# Patient Record
Sex: Female | Born: 2000 | Race: White | Hispanic: No | Marital: Single | Smoking: Former smoker
Health system: Southern US, Community
[De-identification: ages and names within clinical notes are randomized; demographics above are authoritative.]

## PROBLEM LIST (undated history)

## (undated) DIAGNOSIS — F419 Anxiety disorder, unspecified: Secondary | ICD-10-CM

## (undated) DIAGNOSIS — D649 Anemia, unspecified: Secondary | ICD-10-CM

## (undated) DIAGNOSIS — M329 Systemic lupus erythematosus, unspecified: Secondary | ICD-10-CM

## (undated) DIAGNOSIS — E538 Deficiency of other specified B group vitamins: Secondary | ICD-10-CM

## (undated) HISTORY — DX: Anemia, unspecified: D64.9

## (undated) HISTORY — DX: Deficiency of other specified B group vitamins: E53.8

## (undated) HISTORY — PX: NO PAST SURGERIES: SHX2092

## (undated) HISTORY — DX: Anxiety disorder, unspecified: F41.9

---
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
## 2020-06-10 ENCOUNTER — Emergency Department (HOSPITAL_BASED_OUTPATIENT_CLINIC_OR_DEPARTMENT_OTHER): Payer: No Typology Code available for payment source

## 2020-06-10 ENCOUNTER — Encounter (HOSPITAL_BASED_OUTPATIENT_CLINIC_OR_DEPARTMENT_OTHER): Payer: Self-pay | Admitting: Emergency Medicine

## 2020-06-10 ENCOUNTER — Other Ambulatory Visit: Payer: Self-pay

## 2020-06-10 DIAGNOSIS — Y929 Unspecified place or not applicable: Secondary | ICD-10-CM | POA: Insufficient documentation

## 2020-06-10 DIAGNOSIS — S20219A Contusion of unspecified front wall of thorax, initial encounter: Secondary | ICD-10-CM | POA: Insufficient documentation

## 2020-06-10 DIAGNOSIS — Y939 Activity, unspecified: Secondary | ICD-10-CM | POA: Diagnosis not present

## 2020-06-10 DIAGNOSIS — S92424A Nondisplaced fracture of distal phalanx of right great toe, initial encounter for closed fracture: Secondary | ICD-10-CM | POA: Diagnosis not present

## 2020-06-10 DIAGNOSIS — S90931A Unspecified superficial injury of right great toe, initial encounter: Secondary | ICD-10-CM | POA: Diagnosis present

## 2020-06-10 DIAGNOSIS — Y999 Unspecified external cause status: Secondary | ICD-10-CM | POA: Diagnosis not present

## 2020-06-10 DIAGNOSIS — S0081XA Abrasion of other part of head, initial encounter: Secondary | ICD-10-CM | POA: Insufficient documentation

## 2020-06-10 DIAGNOSIS — R040 Epistaxis: Secondary | ICD-10-CM | POA: Diagnosis not present

## 2020-06-10 DIAGNOSIS — S0083XA Contusion of other part of head, initial encounter: Secondary | ICD-10-CM | POA: Insufficient documentation
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
## 2020-06-11 ENCOUNTER — Emergency Department (HOSPITAL_BASED_OUTPATIENT_CLINIC_OR_DEPARTMENT_OTHER)
Admission: EM | Admit: 2020-06-11 | Discharge: 2020-06-11 | Disposition: A | Discharge status: Home / Self Care | Payer: No Typology Code available for payment source | Attending: Emergency Medicine | Admitting: Emergency Medicine

## 2020-06-11 DIAGNOSIS — S0081XA Abrasion of other part of head, initial encounter: Secondary | ICD-10-CM

## 2020-06-11 DIAGNOSIS — S0083XA Contusion of other part of head, initial encounter: Secondary | ICD-10-CM

## 2020-06-11 DIAGNOSIS — R040 Epistaxis: Secondary | ICD-10-CM

## 2020-06-11 DIAGNOSIS — S92424A Nondisplaced fracture of distal phalanx of right great toe, initial encounter for closed fracture: Secondary | ICD-10-CM

## 2020-06-11 DIAGNOSIS — S20219A Contusion of unspecified front wall of thorax, initial encounter: Secondary | ICD-10-CM

## 2020-06-11 MED ORDER — NAPROXEN 250 MG PO TABS
500.0000 mg | ORAL_TABLET | Freq: Once | ORAL | Status: AC
  Administered 2020-06-11: 500 mg via ORAL
  Filled 2020-06-11: qty 2

## 2020-06-11 MED ORDER — NAPROXEN 375 MG PO TABS: ORAL_TABLET | ORAL | 0 refills | Status: DC
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
## 2022-05-06 DIAGNOSIS — J029 Acute pharyngitis, unspecified: Secondary | ICD-10-CM | POA: Diagnosis not present

## 2022-05-21 ENCOUNTER — Encounter (HOSPITAL_COMMUNITY): Payer: Self-pay

## 2022-05-21 ENCOUNTER — Emergency Department (HOSPITAL_COMMUNITY)
Admission: EM | Admit: 2022-05-21 | Discharge: 2022-05-21 | Discharge status: Against Medical Advice | Payer: 59 | Attending: Emergency Medicine | Admitting: Emergency Medicine

## 2022-05-21 ENCOUNTER — Other Ambulatory Visit: Payer: Self-pay

## 2022-05-21 ENCOUNTER — Emergency Department (HOSPITAL_COMMUNITY): Payer: 59

## 2022-05-21 DIAGNOSIS — N939 Abnormal uterine and vaginal bleeding, unspecified: Secondary | ICD-10-CM | POA: Diagnosis not present

## 2022-05-21 DIAGNOSIS — N930 Postcoital and contact bleeding: Secondary | ICD-10-CM | POA: Diagnosis not present

## 2022-05-21 DIAGNOSIS — N854 Malposition of uterus: Secondary | ICD-10-CM | POA: Diagnosis not present

## 2022-05-21 DIAGNOSIS — R109 Unspecified abdominal pain: Secondary | ICD-10-CM | POA: Diagnosis not present

## 2022-05-21 DIAGNOSIS — Z5321 Procedure and treatment not carried out due to patient leaving prior to being seen by health care provider: Secondary | ICD-10-CM | POA: Insufficient documentation

## 2022-05-21 LAB — URINALYSIS, ROUTINE W REFLEX MICROSCOPIC
Bacteria, UA: NONE SEEN
Bilirubin Urine: NEGATIVE
Glucose, UA: NEGATIVE mg/dL
Ketones, ur: 5 mg/dL — AB
Nitrite: NEGATIVE
Protein, ur: NEGATIVE mg/dL
Specific Gravity, Urine: 1.027 (ref 1.005–1.030)
pH: 5 (ref 5.0–8.0)

## 2022-05-21 LAB — COMPREHENSIVE METABOLIC PANEL
ALT: 17 U/L (ref 0–44)
AST: 16 U/L (ref 15–41)
Albumin: 3.5 g/dL (ref 3.5–5.0)
Alkaline Phosphatase: 44 U/L (ref 38–126)
Anion gap: 8 (ref 5–15)
BUN: 10 mg/dL (ref 6–20)
CO2: 23 mmol/L (ref 22–32)
Calcium: 9.2 mg/dL (ref 8.9–10.3)
Chloride: 106 mmol/L (ref 98–111)
Creatinine, Ser: 0.61 mg/dL (ref 0.44–1.00)
GFR, Estimated: >60 mL/min (ref >60)
Glucose, Bld: 87 mg/dL (ref 70–99)
Potassium: 3.6 mmol/L (ref 3.5–5.1)
Sodium: 137 mmol/L (ref 135–145)
Total Bilirubin: 0.5 mg/dL (ref 0.3–1.2)
Total Protein: 7.1 g/dL (ref 6.5–8.1)

## 2022-05-21 LAB — I-STAT BETA HCG BLOOD, ED (MC, WL, AP ONLY): I-stat hCG, quantitative: <5 m[IU]/mL (ref <5)

## 2022-05-21 LAB — LIPASE, BLOOD: Lipase: 30 U/L (ref 11–51)

## 2022-05-21 LAB — CBC
HCT: 32.4 % — ABNORMAL LOW (ref 36.0–46.0)
Hemoglobin: 9.8 g/dL — ABNORMAL LOW (ref 12.0–15.0)
MCH: 19.4 pg — ABNORMAL LOW (ref 26.0–34.0)
MCHC: 30.2 g/dL (ref 30.0–36.0)
MCV: 64 fL — ABNORMAL LOW (ref 80.0–100.0)
Platelets: 318 10*3/uL (ref 150–400)
RBC: 5.06 MIL/uL (ref 3.87–5.11)
RDW: 14.7 % (ref 11.5–15.5)
WBC: 9.4 10*3/uL (ref 4.0–10.5)
nRBC: 0 % (ref 0.0–0.2)
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
## 2022-05-21 NOTE — ED Provider Triage Note (Signed)
Emergency Medicine Provider Triage Evaluation Note  Anita [name] , a 21 y.o. female  was evaluated in triage.  Pt complains of vaginal bleeding, severe abdominal pain that radiates to the back for the last 2 weeks.  Patient reports that she is on birth control, not supposed to have her period for at least 2 weeks.  Patient reports that she was having fairly rough sex with her partner, but she has easy bleeding with any intercourse or fingering.  Patient reports that this was slightly rougher than normal, but she was not having significant pain during intercourse.  Patient reports that [facility] to [facility], but was told to go straight to the emergency department for further evaluation because they cannot do anything there.  Patient reports of large volume of bleeding with blood running down bilateral legs and onto sheets.  Review of Systems  Positive: Vaginal bleeding, abdominal/pelvic pain Negative: Nausea, vomiting, diarrhea  Physical Exam  BP 121/86   Pulse 84   Temp 98 F (36.7 C)   Resp 18   Ht 5\' 2"  (1.575 m)   Wt 55.3 kg   SpO2 99%   BMI 22.31 kg/m  Gen:   Awake, no distress   Resp:  Normal effort  MSK:   Moves extremities without difficulty  Other:  minimal tenderness palpation suprapubically, no rebound, rigidity, guarding  Medical Decision Making  Medically screening exam initiated at 6:13 PM.  Appropriate orders placed.  RAEGAN WINDERS was informed that the remainder of the evaluation will be completed by another provider, this initial triage assessment does not replace that evaluation, and the importance of remaining in the ED until their evaluation is complete.  Workup initiated   Maretta Los, Olene Floss 05/21/22 1815

## 2022-05-21 NOTE — ED Notes (Signed)
Pt left AMA.  Pt said if anything get worse she will come back.

## 2022-10-09 ENCOUNTER — Encounter: Payer: Self-pay | Admitting: Plastic Surgery

## 2022-10-09 ENCOUNTER — Ambulatory Visit (INDEPENDENT_AMBULATORY_CARE_PROVIDER_SITE_OTHER): Payer: Self-pay | Admitting: Plastic Surgery

## 2022-10-09 VITALS — BP 122/78 | HR 100 | Ht 63.0 in | Wt 125.4 lb

## 2022-10-09 DIAGNOSIS — Z719 Counseling, unspecified: Secondary | ICD-10-CM

## 2022-10-09 DIAGNOSIS — M950 Acquired deformity of nose: Secondary | ICD-10-CM

## 2022-10-09 NOTE — Progress Notes (Signed)
     Patient ID: Anita [name], female    DOB: [birth year], 22 y.o.   MRN: [id number]   Chief Complaint  Patient presents with   Advice Only    The patient is a 22 year old female here with mom for evaluation of her nose.  The patient states that for as long as she can remember she has not liked the size or look of her nose.  She is very happy with the tip.  It deviates slightly to the right.  She is not aware of any trauma.  The main issue is she does not like her dorsal hump.  She has no breathing issues and has not had nasal surgery in the past.  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  No airway issues.    Review of Systems  Constitutional: Negative.   HENT: Negative.    Eyes: Negative.   Respiratory: Negative.    Cardiovascular: Negative.   Gastrointestinal: Negative.   Endocrine: Negative.   Genitourinary: Negative.   Musculoskeletal: Negative.     History reviewed. No pertinent past medical history.  History reviewed. No pertinent surgical history.    Current Outpatient Medications:    Norgestimate-Ethinyl Estradiol Triphasic (TRI-LO-SPRINTEC) 0.18/0.215/0.25 MG-25 MCG tab, Take by mouth., Disp: , Rfl:    Objective:   Vitals:   10/09/22 1141  BP: 122/78  Pulse: 100  SpO2: 100%    Physical Exam Vitals reviewed.  Constitutional:      Appearance: Normal appearance.  HENT:     Head: Normocephalic and atraumatic.  Cardiovascular:     Rate and Rhythm: Normal rate.     Pulses: Normal pulses.  Musculoskeletal:        General: No swelling or deformity.  Skin:    General: Skin is warm.     Capillary Refill: Capillary refill takes less than 2 seconds.     Coloration: Skin is not jaundiced or pale.     Findings: No bruising or lesion.  Neurological:     Mental Status: She is alert and oriented to person, place, and time.  Psychiatric:        Mood and Affect:  Mood normal.        Behavior: Behavior normal.        Thought Content: Thought content normal.        Judgment: Judgment normal.     Assessment & Plan:  Nasal hump  The patient is a good candidate for a rhinoplasty for relief of the dorsal hump.  That might cause tip of her nose to drop and have to be addressed but overall her main concern is the dorsal hump.  We will provide her with a quote and then set up a virtual visit for further discussion.  Pictures were obtained of the patient and placed in the chart with the patient's or guardian's permission.   [place], DO

## 2022-10-11 ENCOUNTER — Encounter: Payer: Self-pay | Admitting: *Deleted

## 2022-11-20 ENCOUNTER — Ambulatory Visit (INDEPENDENT_AMBULATORY_CARE_PROVIDER_SITE_OTHER): Payer: Self-pay | Admitting: Plastic Surgery

## 2022-11-20 ENCOUNTER — Encounter: Payer: Self-pay | Admitting: Plastic Surgery

## 2022-11-20 DIAGNOSIS — M950 Acquired deformity of nose: Secondary | ICD-10-CM

## 2022-11-20 NOTE — Progress Notes (Signed)
The patient is a very lovely 22 year old who is interested in a rhinoplasty.  We talked about this before.  Today she is a little bit nervous about the surgery so she is asking me about filler.  I think filler would make her nose look bigger and not smaller.  In order to provide her with confidence that I really do think a rhinoplasty would do her well and meet her goals, I am going to refer her to Dr. Karle Plumber.  I asked the patient to let me know how it goes.

## 2022-11-23 ENCOUNTER — Telehealth: Payer: Self-pay

## 2022-11-23 NOTE — Telephone Encounter (Addendum)
Faxed referral to Dr. Sabino Gasser with [facility] Received fax confirmation

## 2022-11-27 ENCOUNTER — Telehealth: Payer: 59 | Admitting: Plastic Surgery

## 2022-12-18 ENCOUNTER — Ambulatory Visit (INDEPENDENT_AMBULATORY_CARE_PROVIDER_SITE_OTHER): Payer: 59 | Admitting: Radiology

## 2022-12-18 ENCOUNTER — Encounter: Payer: Self-pay | Admitting: Radiology

## 2022-12-18 VITALS — BP 104/66 | Ht 62.5 in | Wt 125.0 lb

## 2022-12-18 DIAGNOSIS — Z3041 Encounter for surveillance of contraceptive pills: Secondary | ICD-10-CM | POA: Diagnosis not present

## 2022-12-18 DIAGNOSIS — N939 Abnormal uterine and vaginal bleeding, unspecified: Secondary | ICD-10-CM | POA: Diagnosis not present

## 2022-12-18 DIAGNOSIS — R102 Pelvic and perineal pain: Secondary | ICD-10-CM

## 2022-12-18 DIAGNOSIS — Z113 Encounter for screening for infections with a predominantly sexual mode of transmission: Secondary | ICD-10-CM | POA: Diagnosis not present

## 2022-12-18 MED ORDER — JUNEL FE 24 1-20 MG-MCG(24) PO TABS: 1.0000 | ORAL_TABLET | Freq: Every day | ORAL | 0 refills | Status: DC

## 2022-12-18 NOTE — Progress Notes (Signed)
   RODERICKA WERKING 2001/04/04 [id number]   History:  22 y.o. 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 Sharp stabbing vaginal pain intermittent x's few weeks   Gynecologic History Patient's last menstrual period was 12/10/2022 (exact date). Period Pattern: (!) Irregular Menstrual Flow: Heavy Menstrual Control: Thin pad, Tampon, Maxi pad Dysmenorrhea: (!) Moderate (moderate to severe) Dysmenorrhea Symptoms: Cramping Contraception/Family planning: OCP (estrogen/progesterone) Sexually active: yes   Obstetric History OB History  Gravida Para Term Preterm AB Living  0 0 0 0 0 0  SAB IAB Ectopic Multiple Live Births  0 0 0 0 0     The following portions of the patient's history were reviewed and updated as appropriate: allergies, current medications, past family history, past medical history, past social history, past surgical history, and problem list.  Review of Systems Pertinent items noted in HPI and remainder of comprehensive ROS otherwise negative.   Past medical history, past surgical history, family history and social history were all reviewed and documented in the EPIC chart.   Exam:  Vitals:   12/18/22 1525  BP: 104/66  Weight: 125 lb (56.7 kg)  Height: 5' 2.5" (1.588 m)   Body mass index is 22.5 kg/m.  General appearance:  Normal Abdominal  Soft,nontender, without masses, guarding or rebound.  Liver/spleen:  No organomegaly noted  Hernia:  None appreciated  Skin  Inspection:  Grossly normal Genitourinary   Inguinal/mons:  Normal without inguinal adenopathy  External genitalia:  Normal appearing vulva with no masses, tenderness, or lesions  BUS/Urethra/Skene's glands:  Normal without masses or exudate  Vagina:  Normal appearing with normal color and discharge, no lesions  Cervix:  Normal appearing without discharge or  lesions  Uterus:  Normal in size, shape and contour.  Mobile, nontender  Adnexa/parametria:     Rt: Normal in size, without masses or tenderness.   Lt: Normal in size, without masses or tenderness.  Anus and perineum: Normal   Leatrice Jewels, CMA present for exam  Assessment/Plan:   1. Abnormal uterine bleeding Will change pill to a lower estrogen pill to decrease bleeding - Pregnancy, urine  2. Pelvic pain - Urinalysis,Complete w/RFL Culture  3. Oral contraceptive pill surveillance - Norethindrone Acetate-Ethinyl Estrad-FE (JUNEL FE 24) 1-20 MG-MCG(24) tablet; Take 1 tablet by mouth daily.  Dispense: 84 tablet; Refill: 0  4. Screening for STDs (sexually transmitted diseases) - SURESWAB CT/NG/T. vaginalis    Follow up 3 months for med check and AEX- has never had pap  Latoy Labriola B WHNP-BC 3:55 PM 12/18/2022

## 2022-12-19 LAB — SURESWAB CT/NG/T. VAGINALIS
C. trachomatis RNA, TMA: NOT DETECTED
N. gonorrhoeae RNA, TMA: NOT DETECTED
Trichomonas vaginalis RNA: NOT DETECTED

## 2022-12-20 LAB — URINE CULTURE
MICRO NUMBER:: 14711316
SPECIMEN QUALITY:: ADEQUATE

## 2022-12-20 LAB — URINALYSIS, COMPLETE W/RFL CULTURE
Bilirubin Urine: NEGATIVE
Glucose, UA: NEGATIVE
Hyaline Cast: NONE SEEN /LPF
Ketones, ur: NEGATIVE
Nitrites, Initial: NEGATIVE
Protein, ur: NEGATIVE
Specific Gravity, Urine: 1.02 (ref 1.001–1.035)
pH: 7 (ref 5.0–8.0)

## 2022-12-20 LAB — PREGNANCY, URINE: Preg Test, Ur: NEGATIVE

## 2022-12-20 LAB — CULTURE INDICATED

## 2023-02-10 ENCOUNTER — Other Ambulatory Visit: Payer: Self-pay | Admitting: Radiology

## 2023-02-10 DIAGNOSIS — Z3041 Encounter for surveillance of contraceptive pills: Secondary | ICD-10-CM

## 2023-02-11 ENCOUNTER — Other Ambulatory Visit: Payer: Self-pay

## 2023-02-11 DIAGNOSIS — Z3041 Encounter for surveillance of contraceptive pills: Secondary | ICD-10-CM

## 2023-02-11 NOTE — Telephone Encounter (Signed)
From: Maretta Los To: Office of Newberg, Clearnce Hasten B, NP Sent: 02/11/2023 10:56 AM EDT Subject: Medication Renewal Request  Refills have been requested for the following medications:   Norethindrone Acetate-Ethinyl Estrad-FE (JUNEL FE 24) 1-20 MG-MCG(24) tablet [CHRZANOWSKI, JAMI B]  Patient Comment: I received a call and message stating my prescription could not be filled due to not having an appointment, but I thought I had an appointment made for June 19 at 2:30.  Preferred pharmacy: [id number] [place], [place] - [address] Delivery method: Daryll Drown

## 2023-02-11 NOTE — Telephone Encounter (Signed)
Last AEX- OV 12/18/2022 for pelvic pain, AUB, STI screening, and COC surveillance  Advised to f/u in 3months for meds and AEX. Pt scheduled for 03/20/2023  Rx pend.

## 2023-02-11 NOTE — Telephone Encounter (Signed)
Med refill request: Junel Fe 1-20 Last AEX: OV 12/18/22 -return for 56mo f/u and AEX Next AEX:Not scheduled Last MMG (if hormonal med) N/A Call placed to patient, left detailed message, ok per dpr. Advised f/u on RF request received for OCP. Please return call to office at [phone], opt 1 to schedule 56mo f/u and AEX.   Rx sent on 12/18/22 for 56mo supply. Patient should have medication remaining until mid June.   Refill authorized: Please Advise? Rx refused. Patient needs 56mo f/u and AEX  Routing to covering provider, Dr. Edward Jolly.

## 2023-02-12 MED ORDER — JUNEL FE 24 1-20 MG-MCG(24) PO TABS: 1.0000 | ORAL_TABLET | Freq: Every day | ORAL | 1 refills | Status: DC

## 2023-03-12 NOTE — Telephone Encounter (Signed)
Will route to provider for final review and close.  

## 2023-03-12 NOTE — Telephone Encounter (Signed)
I reviewed visit notes, no signs of DVT. She is on a lower dose BC, should not cause these symptoms. If she feels more comfortable switching to a progestin only method we can do that. I would also scheduled with a PCP to establish care.

## 2023-03-20 ENCOUNTER — Other Ambulatory Visit (HOSPITAL_COMMUNITY)
Admission: RE | Admit: 2023-03-20 | Discharge: 2023-03-20 | Disposition: A | Discharge status: Home / Self Care | Payer: BC Managed Care – PPO | Source: Ambulatory Visit | Attending: Radiology | Admitting: Radiology

## 2023-03-20 ENCOUNTER — Encounter: Payer: Self-pay | Admitting: Radiology

## 2023-03-20 ENCOUNTER — Ambulatory Visit (INDEPENDENT_AMBULATORY_CARE_PROVIDER_SITE_OTHER): Payer: BC Managed Care – PPO | Admitting: Radiology

## 2023-03-20 VITALS — BP 112/76 | Ht 62.0 in | Wt 127.0 lb

## 2023-03-20 DIAGNOSIS — Z3041 Encounter for surveillance of contraceptive pills: Secondary | ICD-10-CM

## 2023-03-20 DIAGNOSIS — Z113 Encounter for screening for infections with a predominantly sexual mode of transmission: Secondary | ICD-10-CM | POA: Diagnosis present

## 2023-03-20 DIAGNOSIS — N898 Other specified noninflammatory disorders of vagina: Secondary | ICD-10-CM | POA: Diagnosis not present

## 2023-03-20 DIAGNOSIS — Z01419 Encounter for gynecological examination (general) (routine) without abnormal findings: Secondary | ICD-10-CM

## 2023-03-20 LAB — WET PREP FOR TRICH, YEAST, CLUE

## 2023-03-20 MED ORDER — JUNEL FE 24 1-20 MG-MCG(24) PO TABS: 1.0000 | ORAL_TABLET | Freq: Every day | ORAL | 4 refills | Status: DC

## 2023-03-20 NOTE — Progress Notes (Signed)
   TYRESE WEEK October 19, 2000 [id number]   History:  22 y.o. G0 presents for annual exam. C/o vaginal discharge and vaginal dryness.  Gynecologic History Patient's last menstrual period was 03/04/2023 (exact date). Period Cycle (Days): 28 Period Duration (Days): 4 Period Pattern: Regular Menstrual Flow: Moderate Menstrual Control: Thin pad, Maxi pad Dysmenorrhea: (!) Mild Dysmenorrhea Symptoms: Cramping Contraception/Family planning: OCP (estrogen/progesterone) Sexually active: yes  Obstetric History OB History  Gravida Para Term Preterm AB Living  0 0 0 0 0 0  SAB IAB Ectopic Multiple Live Births  0 0 0 0 0     The following portions of the patient's history were reviewed and updated as appropriate: allergies, current medications, past family history, past medical history, past social history, past surgical history, and problem list.  Review of Systems Pertinent items noted in HPI and remainder of comprehensive ROS otherwise negative.   Past medical history, past surgical history, family history and social history were all reviewed and documented in the EPIC chart.   Exam:  Vitals:   03/20/23 1424  BP: 112/76  Weight: 127 lb (57.6 kg)  Height: 5\' 2"  (1.575 m)   Body mass index is 23.23 kg/m.  General appearance:  Normal Thyroid:  Symmetrical, normal in size, without palpable masses or nodularity. Respiratory  Auscultation:  Clear without wheezing or rhonchi Cardiovascular  Auscultation:  Regular rate, without rubs, murmurs or gallops  Edema/varicosities:  Not grossly evident Abdominal  Soft,nontender, without masses, guarding or rebound.  Liver/spleen:  No organomegaly noted  Hernia:  None appreciated  Skin  Inspection:  Grossly normal Breasts: Examined lying and sitting.   Right: Without masses, retractions, nipple discharge or axillary adenopathy.   Left: Without masses, retractions, nipple discharge or axillary adenopathy. Genitourinary    Inguinal/mons:  Normal without inguinal adenopathy  External genitalia:  Normal appearing vulva with no masses, tenderness, or lesions  BUS/Urethra/Skene's glands:  Normal without masses or exudate  Vagina:  Thick white discharge, no lesions, wet mount swab obtained  Cervix:  Normal appearing without discharge or lesions  Uterus:  Normal in size, shape and contour.  Mobile, nontender  Adnexa/parametria:     Rt: Normal in size, without masses or tenderness.   Lt: Normal in size, without masses or tenderness.  Anus and perineum: Normal   Raynelle Fanning, CMA present for exam  Microscopic wet-mount exam shows negative for pathogens, normal epithelial cells.   Assessment/Plan:   1. Well woman exam with routine gynecological exam - Cytology - PAP( [facility])  2. Screening for STDs (sexually transmitted diseases) - Cytology - PAP( [place])  3. Vaginal discharge Reassured neg wet prep - WET PREP FOR TRICH, YEAST, CLUE  4. Oral contraceptive pill surveillance - Norethindrone Acetate-Ethinyl Estrad-FE (JUNEL FE 24) 1-20 MG-MCG(24) tablet; Take 1 tablet by mouth daily.  Dispense: 84 tablet; Refill: 4     Discussed SBE, pap and STI screening as directed/appropriate. Recommend of exercise weekly, including weight bearing exercise. Encouraged the use of seatbelts and sunscreen. Return in 1 year for annual or as needed.   Arlie Solomons B WHNP-BC 2:39 PM 03/20/2023

## 2023-03-24 LAB — CYTOLOGY - PAP
Chlamydia: NEGATIVE
Comment: NORMAL
Diagnosis: UNDETERMINED — AB

## 2023-03-25 LAB — CYTOLOGY - PAP
Comment: NEGATIVE
Comment: NEGATIVE
Comment: NEGATIVE
High risk HPV: NEGATIVE
Neisseria Gonorrhea: NEGATIVE
Trichomonas: NEGATIVE

## 2023-04-22 ENCOUNTER — Ambulatory Visit: Payer: BC Managed Care – PPO | Admitting: Nurse Practitioner

## 2023-04-22 ENCOUNTER — Encounter: Payer: Self-pay | Admitting: Nurse Practitioner

## 2023-04-22 VITALS — BP 104/62 | HR 72 | Wt 127.0 lb

## 2023-04-22 DIAGNOSIS — N898 Other specified noninflammatory disorders of vagina: Secondary | ICD-10-CM

## 2023-04-22 DIAGNOSIS — N941 Unspecified dyspareunia: Secondary | ICD-10-CM | POA: Diagnosis not present

## 2023-04-22 DIAGNOSIS — B9689 Other specified bacterial agents as the cause of diseases classified elsewhere: Secondary | ICD-10-CM

## 2023-04-22 DIAGNOSIS — N760 Acute vaginitis: Secondary | ICD-10-CM

## 2023-04-22 LAB — WET PREP FOR TRICH, YEAST, CLUE

## 2023-04-22 MED ORDER — METRONIDAZOLE 500 MG PO TABS
500.0000 mg | ORAL_TABLET | Freq: Two times a day (BID) | ORAL | 0 refills | Status: DC
Start: 2023-04-22 — End: 2024-04-17

## 2023-04-22 NOTE — Telephone Encounter (Signed)
LVMTCB

## 2023-04-22 NOTE — Progress Notes (Signed)
   Acute Office Visit  Subjective:    Patient ID: Anita [name], female    DOB: 2001/01/21, 22 y.o.   MRN: [id number]   HPI 22 y.o. G0 presents today for vaginal itching and painful intercourse. Noticed increase in discharge this morning but looked "normal". No odor. Itching was severe last night and internal only. Negative STD screening in June. Has been trying new lubricants.   Patient's last menstrual period was 03/28/2023.    Review of Systems  Constitutional: Negative.   Genitourinary:  Positive for dyspareunia, vaginal discharge and vaginal pain (Itching).       Objective:    Physical Exam Constitutional:      Appearance: Normal appearance.  Genitourinary:    General: Normal vulva.     Vagina: Vaginal discharge present. No erythema.     Cervix: Normal.     BP 104/62   Pulse 72   Wt 127 lb (57.6 kg)   LMP 03/28/2023   SpO2 100%   BMI 23.23 kg/m  Wt Readings from Last 3 Encounters:  04/22/23 127 lb (57.6 kg)  03/20/23 127 lb (57.6 kg)  12/18/22 125 lb (56.7 kg)        Patient informed chaperone available to be present for breast and/or pelvic exam. Patient has requested no chaperone to be present. Patient has been advised what will be completed during breast and pelvic exam.   Wet prep + clue cells (+ odor)  Assessment & Plan:   Problem List Items Addressed This Visit   None Visit Diagnoses     Bacterial vaginosis    -  Primary   Relevant Medications   metroNIDAZOLE (FLAGYL) 500 MG tablet   Vaginal itching       Relevant Orders   WET PREP FOR TRICH, YEAST, CLUE   Dyspareunia in female          Plan: Wet prep positive for clue cells - Flagyl 500 mg BID x 7 days. Likely from trying different lubricants. Recommend coconut oil.      Olivia Mackie DNP, 12:41 PM 04/22/2023

## 2023-06-12 ENCOUNTER — Emergency Department (HOSPITAL_BASED_OUTPATIENT_CLINIC_OR_DEPARTMENT_OTHER)
Admission: EM | Admit: 2023-06-12 | Discharge: 2023-06-12 | Disposition: A | Discharge status: Home / Self Care | Payer: Self-pay | Attending: Emergency Medicine | Admitting: Emergency Medicine

## 2023-06-12 ENCOUNTER — Encounter (HOSPITAL_BASED_OUTPATIENT_CLINIC_OR_DEPARTMENT_OTHER): Payer: Self-pay | Admitting: Emergency Medicine

## 2023-06-12 ENCOUNTER — Emergency Department (HOSPITAL_BASED_OUTPATIENT_CLINIC_OR_DEPARTMENT_OTHER): Payer: Self-pay | Admitting: Radiology

## 2023-06-12 DIAGNOSIS — R0602 Shortness of breath: Secondary | ICD-10-CM | POA: Insufficient documentation

## 2023-06-12 DIAGNOSIS — R072 Precordial pain: Secondary | ICD-10-CM | POA: Insufficient documentation

## 2023-06-12 LAB — BASIC METABOLIC PANEL
Anion gap: 11 (ref 5–15)
BUN: 10 mg/dL (ref 6–20)
CO2: 22 mmol/L (ref 22–32)
Calcium: 9.6 mg/dL (ref 8.9–10.3)
Chloride: 104 mmol/L (ref 98–111)
Creatinine, Ser: 0.55 mg/dL (ref 0.44–1.00)
GFR, Estimated: >60 mL/min (ref >60)
Glucose, Bld: 77 mg/dL (ref 70–99)
Potassium: 3.6 mmol/L (ref 3.5–5.1)
Sodium: 137 mmol/L (ref 135–145)

## 2023-06-12 LAB — CBC
HCT: 33.5 % — ABNORMAL LOW (ref 36.0–46.0)
Hemoglobin: 10.3 g/dL — ABNORMAL LOW (ref 12.0–15.0)
MCH: 19.6 pg — ABNORMAL LOW (ref 26.0–34.0)
MCHC: 30.7 g/dL (ref 30.0–36.0)
MCV: 63.7 fL — ABNORMAL LOW (ref 80.0–100.0)
Platelets: 354 10*3/uL (ref 150–400)
RBC: 5.26 MIL/uL — ABNORMAL HIGH (ref 3.87–5.11)
RDW: 14.9 % (ref 11.5–15.5)
WBC: 10.3 10*3/uL (ref 4.0–10.5)
nRBC: 0 % (ref 0.0–0.2)

## 2023-06-12 LAB — TROPONIN I (HIGH SENSITIVITY): Troponin I (High Sensitivity): <2 ng/L (ref <18)

## 2023-06-12 LAB — PREGNANCY, URINE: Preg Test, Ur: NEGATIVE

## 2023-06-12 MED ORDER — LIDOCAINE 5 % EX PTCH
1.0000 | MEDICATED_PATCH | CUTANEOUS | Status: DC
  Administered 2023-06-12: 1 via TRANSDERMAL
  Filled 2023-06-12: qty 1

## 2023-06-12 NOTE — ED Triage Notes (Signed)
Chest pain and sob since June. Constant Vague numbness in arm that last about 10 minutes

## 2023-06-12 NOTE — ED Provider Notes (Signed)
[place] EMERGENCY DEPARTMENT AT [facility] Provider Note   CSN: [id number] Arrival date & time: 06/12/23  1734     History  Chief Complaint  Patient presents with   Chest Pain    Anita [name] is a 22 y.o. female.  Patient with noncontributory past medical history presents today with complaints of chest pain.  She states that same began originally in June 2024 and has been persistent since then.  States that she has been seen by her primary doctor as well as urgent care and has had evaluations done both of these places without any findings.  She states that she was originally treated for GERD but has not been given any relief with the use medications.  Her pain is left-sided in nature and sometimes radiates into her arms. She notes associated shortness of breath.  Denies any leg pain or leg swelling.  No recent travel or surgeries.  No history of malignancy.  Does note a remote history of vaping but states that she stopped this over a year ago.  Denies any tobacco use or recreational drug use.  She is on OCPs.  She states she has a cardiologist appointment but is not until October.  She denies any PND or orthopnea.  No cough or congestion.  No diaphoresis, nausea, or vomiting.  She notes that she is feeling short of breath constantly but it is not worse with exertion or relieved with rest.  Her pain is reproducible to palpation but is not improved with Tylenol and ibuprofen.  The history is provided by the patient. No language interpreter was used.  Chest Pain Associated symptoms: shortness of breath        Home Medications Prior to Admission medications   Medication Sig Start Date End Date Taking? Authorizing Provider  metroNIDAZOLE (FLAGYL) 500 MG tablet Take 1 tablet (500 mg total) by mouth 2 (two) times daily. 04/22/23   Olivia Mackie, NP  naproxen (NAPROSYN) 500 MG tablet Take by mouth. 03/12/23   [provider]  Norethindrone Acetate-Ethinyl  Estrad-FE (JUNEL FE 24) 1-20 MG-MCG(24) tablet Take 1 tablet by mouth daily. 03/20/23   Chrzanowski, Clearnce Hasten B, NP      Allergies    Amoxicillin    Review of Systems   Review of Systems  Respiratory:  Positive for shortness of breath.   Cardiovascular:  Positive for chest pain.  All other systems reviewed and are negative.   Physical Exam Updated Vital Signs BP 119/72 (BP Location: Right Arm)   Pulse 81   Temp 98.3 F (36.8 C) (Oral)   Resp 18   LMP 05/06/2023   SpO2 100%  Physical Exam Vitals and nursing note reviewed.  Constitutional:      General: She is not in acute distress.    Appearance: Normal appearance. She is normal weight. She is not ill-appearing, toxic-appearing or diaphoretic.  HENT:     Head: Normocephalic and atraumatic.  Cardiovascular:     Rate and Rhythm: Normal rate and regular rhythm.     Pulses:          Radial pulses are 2+ on the right side and 2+ on the left side.       Dorsalis pedis pulses are 2+ on the right side and 2+ on the left side.       Posterior tibial pulses are 2+ on the right side and 2+ on the left side.     Heart sounds: Normal heart sounds.  Pulmonary:  Effort: Pulmonary effort is normal. No respiratory distress.     Breath sounds: Normal breath sounds.  Chest:     Chest wall: Tenderness present.     Comments: Left-sided chest tenderness to palpation without crepitus, deformity, erythema, warmth, or overlying skin changes. Abdominal:     Palpations: Abdomen is soft.     Tenderness: There is no abdominal tenderness.  Musculoskeletal:        General: Normal range of motion.     Cervical back: Normal range of motion.     Right lower leg: No tenderness. No edema.     Left lower leg: No tenderness. No edema.  Skin:    General: Skin is warm and dry.  Neurological:     General: No focal deficit present.     Mental Status: She is alert.  Psychiatric:        Mood and Affect: Mood normal.        Behavior: Behavior normal.      ED Results / Procedures / Treatments   Labs (all labs ordered are listed, but only abnormal results are displayed) Labs Reviewed  CBC - Abnormal; Notable for the following components:      Result Value   RBC 5.26 (*)    Hemoglobin 10.3 (*)    HCT 33.5 (*)    MCV 63.7 (*)    MCH 19.6 (*)    All other components within normal limits  BASIC METABOLIC PANEL  PREGNANCY, URINE  TROPONIN I (HIGH SENSITIVITY)    EKG None  Radiology DG Chest 2 View  Result Date: 06/12/2023 CLINICAL DATA:  Chest pain EXAM: CHEST - 2 VIEW COMPARISON:  06/10/2020 FINDINGS: Normal cardiac and mediastinal contours. No focal pulmonary opacity. No pleural effusion or pneumothorax. No acute osseous abnormality. Scoliosis. IMPRESSION: No active cardiopulmonary disease. Electronically Signed   By: Wiliam Ke M.D.   On: 06/12/2023 19:27    Procedures Procedures    Medications Ordered in ED Medications  lidocaine (LIDODERM) 5 % 1 patch (1 patch Transdermal Patch Applied 06/12/23 2145)    ED Course/ Medical Decision Making/ A&P                                 Medical Decision Making Amount and/or Complexity of Data Reviewed Labs: ordered. Radiology: ordered.  Risk Prescription drug management.   This patient is a 22 y.o. female who presents to the ED for concern of chest pain, shortness of breath, this involves an extensive number of treatment options, and is a complaint that carries with it a high risk of complications and morbidity. The emergent differential diagnosis prior to evaluation includes, but is not limited to,  ACS, pericarditis, myocarditis, aortic dissection, PE, pneumothorax, esophageal spasm or rupture, chronic angina, pneumonia, bronchitis, GERD, reflux/PUD, biliary disease, pancreatitis, costochondritis, anxiety   This is not an exhaustive differential.   Past Medical History / Co-morbidities / Social History:  has no past medical history on file.   Additional  history: Chart reviewed. Pertinent results include: patient seen by urgent care in June and had chest x-ray, EKG, and d dimer which was negative  Physical Exam: Physical exam performed. The pertinent findings include: TTP left upper chest. No other abnormal physical exam findings  Lab Tests: I ordered, and personally interpreted labs.  The pertinent results include:  troponin WNL, hgb 10.3 consistent with previous. No other acute laboratory abnormalities   Imaging Studies: I ordered  imaging studies including CXR. I independently visualized and interpreted imaging which showed NAD. I agree with the radiologist interpretation.   Cardiac Monitoring:  The patient was maintained on a cardiac monitor. Cardiac monitored showed an underlying rhythm of: sinus rhythm, no STEMI, no S1Q3T3. I agree with this interpretation.   Medications: I ordered medication including lidocaine patch  for pain. Reevaluation of the patient after these medicines showed that the patient stayed the same. I have reviewed the patients home medicines and have made adjustments as needed.   Disposition: After consideration of the diagnostic results and the patients response to treatment, I feel that emergency department workup does not suggest an emergent condition requiring admission or immediate intervention beyond what has been performed at this time. The plan is: Discharge with close cardiology follow-up and return precautions.  Patient's workup is benign and she is afebrile, nontoxic-appearing, and in no acute distress with reassuring vital signs.  She knows that she is short of breath with conversation, however she is able to speak in complete sentences without any signs of distress.  She is not PERC negative given her OCP use, however she had a D-dimer in June 2024 when the symptoms began that was within normal limits.  I did offer additional evaluation with a repeat D-dimer or CT of her chest, however shared decision making  implemented and patient would prefer to follow-up with her cardiologist instead. Evaluation and diagnostic testing in the emergency department does not suggest an emergent condition requiring admission or immediate intervention beyond what has been performed at this time.  Plan for discharge with close PCP follow-up.  Patient is understanding and amenable with plan, educated on red flag symptoms that would prompt immediate return.  Patient discharged in stable condition.   Final Clinical Impression(s) / ED Diagnoses Final diagnoses:  Precordial chest pain    Rx / DC Orders ED Discharge Orders     None     An After Visit Summary was printed and given to the patient.     Vear Clock 06/12/23 2246    Tegeler, Canary Brim, MD 06/12/23 [phone]

## 2023-06-12 NOTE — ED Notes (Signed)
 RN reviewed discharge instructions with pt. Pt verbalized understanding and had no further questions. VSS upon discharge.  

## 2023-06-12 NOTE — Discharge Instructions (Signed)
As we discussed, your workup in the ER today was reassuring for acute findings.  Laboratory evaluation and x-ray imaging did not reveal any emergent etiology of your symptoms.  I do recommend that you follow-up closely with your cardiologist and call them to see if you can get in for an earlier appointment for continued evaluation and management of the symptoms.  Please call your primary doctor and schedule an appointment as well.  Return if development of any new or worsening symptoms.

## 2023-10-25 ENCOUNTER — Emergency Department (HOSPITAL_COMMUNITY): Payer: Managed Care, Other (non HMO)

## 2023-10-25 ENCOUNTER — Emergency Department (HOSPITAL_COMMUNITY)
Admission: EM | Admit: 2023-10-25 | Discharge: 2023-10-25 | Disposition: A | Discharge status: Home / Self Care | Payer: Managed Care, Other (non HMO) | Attending: Emergency Medicine | Admitting: Emergency Medicine

## 2023-10-25 ENCOUNTER — Encounter (HOSPITAL_COMMUNITY): Payer: Self-pay | Admitting: Emergency Medicine

## 2023-10-25 ENCOUNTER — Other Ambulatory Visit: Payer: Self-pay

## 2023-10-25 DIAGNOSIS — I714 Abdominal aortic aneurysm, without rupture, unspecified: Secondary | ICD-10-CM | POA: Diagnosis not present

## 2023-10-25 DIAGNOSIS — R0602 Shortness of breath: Secondary | ICD-10-CM | POA: Diagnosis not present

## 2023-10-25 DIAGNOSIS — R079 Chest pain, unspecified: Secondary | ICD-10-CM | POA: Diagnosis present

## 2023-10-25 DIAGNOSIS — R000 Tachycardia, unspecified: Secondary | ICD-10-CM | POA: Diagnosis not present

## 2023-10-25 LAB — BASIC METABOLIC PANEL
Anion gap: 13 (ref 5–15)
BUN: 9 mg/dL (ref 6–20)
CO2: 20 mmol/L — ABNORMAL LOW (ref 22–32)
Calcium: 9.4 mg/dL (ref 8.9–10.3)
Chloride: 104 mmol/L (ref 98–111)
Creatinine, Ser: 0.55 mg/dL (ref 0.44–1.00)
GFR, Estimated: >60 mL/min (ref >60)
Glucose, Bld: 93 mg/dL (ref 70–99)
Potassium: 3.3 mmol/L — ABNORMAL LOW (ref 3.5–5.1)
Sodium: 137 mmol/L (ref 135–145)

## 2023-10-25 LAB — TROPONIN I (HIGH SENSITIVITY)
Troponin I (High Sensitivity): 345 ng/L (ref <18)
Troponin I (High Sensitivity): 4 ng/L (ref <18)
Troponin I (High Sensitivity): 6 ng/L (ref <18)

## 2023-10-25 LAB — CBC
HCT: 32.6 % — ABNORMAL LOW (ref 36.0–46.0)
Hemoglobin: 10.2 g/dL — ABNORMAL LOW (ref 12.0–15.0)
MCH: 19.7 pg — ABNORMAL LOW (ref 26.0–34.0)
MCHC: 31.3 g/dL (ref 30.0–36.0)
MCV: 63.1 fL — ABNORMAL LOW (ref 80.0–100.0)
Platelets: 321 10*3/uL (ref 150–400)
RBC: 5.17 MIL/uL — ABNORMAL HIGH (ref 3.87–5.11)
RDW: 15.1 % (ref 11.5–15.5)
WBC: 11.7 10*3/uL — ABNORMAL HIGH (ref 4.0–10.5)
nRBC: 0 % (ref 0.0–0.2)

## 2023-10-25 LAB — D-DIMER, QUANTITATIVE: D-Dimer, Quant: <0.27 ug{FEU}/mL (ref 0.00–0.50)

## 2023-10-25 LAB — HCG, SERUM, QUALITATIVE: Preg, Serum: NEGATIVE

## 2023-10-25 MED ORDER — ASPIRIN 325 MG PO TABS
325.0000 mg | ORAL_TABLET | Freq: Once | ORAL | Status: AC
  Administered 2023-10-25: 325 mg via ORAL
  Filled 2023-10-25: qty 1

## 2023-10-25 MED ORDER — ONDANSETRON HCL 4 MG/2ML IJ SOLN
4.0000 mg | Freq: Once | INTRAMUSCULAR | Status: AC
  Administered 2023-10-25: 4 mg via INTRAVENOUS
  Filled 2023-10-25: qty 2

## 2023-10-25 MED ORDER — IOHEXOL 350 MG/ML SOLN
75.0000 mL | Freq: Once | INTRAVENOUS | Status: AC | PRN
  Administered 2023-10-25: 75 mL via INTRAVENOUS

## 2023-10-25 MED ORDER — MORPHINE SULFATE (PF) 2 MG/ML IV SOLN
2.0000 mg | Freq: Once | INTRAVENOUS | Status: AC
  Administered 2023-10-25: 2 mg via INTRAVENOUS
  Filled 2023-10-25: qty 1

## 2023-10-25 NOTE — Discharge Instructions (Signed)
Your workup tonight was reassuring.  Please follow-up with your cardiologist for further evaluation of your intermittent chest pains.  If you develop shortness of breath or other life-threatening symptoms please return to the emergency department for evaluation.

## 2023-10-25 NOTE — ED Notes (Signed)
This RN reviewed discharge instructions with patient. She verbalized understanding and denied any further questions. PT well appearing upon discharge and reports tolerable pain. Pt ambulated with stable gait to exit. Pt endorses ride home with family

## 2023-10-25 NOTE — ED Triage Notes (Signed)
  Patient comes in with chest pain and SOB that woke her up from sleep around 1 hour ago.  Patient states she has had similar episode and has been following up with cardiologist.  Patient states she had blood work done 2 weeks ago but was unsure of the results.  States the pain was in the middle of her chest and radiated to both arms.  Patient states arms felt numb and was hard to use them.  Denies any N/V, or diaphoresis.  Pain 10/10, aching/stabbing.

## 2023-10-25 NOTE — ED Provider Notes (Signed)
[place] EMERGENCY DEPARTMENT AT [facility] Provider Note   CSN: [id number] Arrival date & time: 10/25/23  0216     History  Chief Complaint  Patient presents with   Chest Pain   Shortness of Breath    Anita [name] is a 23 y.o. female.  Patient presents to the emergency department accompanied by family complaining of chest pain which woke her up from her sleep at approximately 1 AM.  Patient describes the pain as sharp, radiating across the front of her chest, also radiating between her shoulder blades.  She also endorses associated shortness of breath.  She denies abdominal pain, nausea, vomiting.  Patient had a similar episode in July of this year which resolved on its own after approximately 1 hour.  She has subsequently been seen at both one of our med Center departments and by cardiology with grossly negative workups.  No other relevant past medical history on file   Chest Pain Associated symptoms: shortness of breath   Shortness of Breath Associated symptoms: chest pain        Home Medications Prior to Admission medications   Medication Sig Start Date End Date Taking? Authorizing Provider  metroNIDAZOLE (FLAGYL) 500 MG tablet Take 1 tablet (500 mg total) by mouth 2 (two) times daily. 04/22/23   Olivia Mackie, NP  naproxen (NAPROSYN) 500 MG tablet Take by mouth. 03/12/23   [provider]  Norethindrone Acetate-Ethinyl Estrad-FE (JUNEL FE 24) 1-20 MG-MCG(24) tablet Take 1 tablet by mouth daily. 03/20/23   Chrzanowski, Clearnce Hasten B, NP      Allergies    Amoxicillin    Review of Systems   Review of Systems  Respiratory:  Positive for shortness of breath.   Cardiovascular:  Positive for chest pain.    Physical Exam Updated Vital Signs BP 113/78   Pulse 98   Temp 97.7 F (36.5 C) (Oral)   Resp (!) 22   Ht 5\' 2"  (1.575 m)   Wt 57.6 kg   LMP 10/09/2023 (Approximate)   SpO2 100%   BMI 23.23 kg/m  Physical Exam Vitals and nursing note  reviewed.  Constitutional:      General: She is not in acute distress.    Appearance: She is well-developed.  HENT:     Head: Normocephalic and atraumatic.  Eyes:     Conjunctiva/sclera: Conjunctivae normal.  Cardiovascular:     Rate and Rhythm: Regular rhythm. Tachycardia present.     Pulses:          Radial pulses are 1+ on the right side and 2+ on the left side.       Dorsalis pedis pulses are 2+ on the right side and 2+ on the left side.  Pulmonary:     Effort: Pulmonary effort is normal. No respiratory distress.     Breath sounds: Normal breath sounds.  Chest:     Chest wall: No tenderness.  Abdominal:     Palpations: Abdomen is soft.     Tenderness: There is no abdominal tenderness.  Musculoskeletal:        General: No swelling.     Cervical back: Neck supple.  Skin:    General: Skin is warm and dry.     Capillary Refill: Capillary refill takes less than 2 seconds.  Neurological:     Mental Status: She is alert.  Psychiatric:        Mood and Affect: Mood normal.     ED Results / Procedures /  Treatments   Labs (all labs ordered are listed, but only abnormal results are displayed) Labs Reviewed  BASIC METABOLIC PANEL - Abnormal; Notable for the following components:      Result Value   Potassium 3.3 (*)    CO2 20 (*)    All other components within normal limits  CBC - Abnormal; Notable for the following components:   WBC 11.7 (*)    RBC 5.17 (*)    Hemoglobin 10.2 (*)    HCT 32.6 (*)    MCV 63.1 (*)    MCH 19.7 (*)    All other components within normal limits  TROPONIN I (HIGH SENSITIVITY) - Abnormal; Notable for the following components:   Troponin I (High Sensitivity) 345 (*)    All other components within normal limits  HCG, SERUM, QUALITATIVE  D-DIMER, QUANTITATIVE  TROPONIN I (HIGH SENSITIVITY)  TROPONIN I (HIGH SENSITIVITY)    EKG EKG Interpretation Date/Time:  Friday October 25 2023 04:07:04 EST Ventricular Rate:  105 PR Interval:  196 QRS  Duration:  86 QT Interval:  345 QTC Calculation: 456 R Axis:   84  Text Interpretation: Sinus tachycardia Biatrial enlargement Borderline Q waves in lateral leads Borderline T abnormalities, anterior leads Confirmed by Tanda Rockers ([id number]) on 10/25/2023 5:27:24 AM  Radiology CT Angio Chest/Abd/Pel for Dissection W and/or Wo Contrast Result Date: 10/25/2023 CLINICAL DATA:  Chest pain and shortness of breath EXAM: CT ANGIOGRAPHY CHEST, ABDOMEN AND PELVIS TECHNIQUE: Non-contrast CT of the chest was initially obtained. Multidetector CT imaging through the chest, abdomen and pelvis was performed using the standard protocol during bolus administration of intravenous contrast. Multiplanar reconstructed images and MIPs were obtained and reviewed to evaluate the vascular anatomy. RADIATION DOSE REDUCTION: This exam was performed according to the departmental dose-optimization program which includes automated exposure control, adjustment of the mA and/or kV according to patient size and/or use of iterative reconstruction technique. CONTRAST:  75mL OMNIPAQUE IOHEXOL 350 MG/ML SOLN COMPARISON:  None Available. FINDINGS: CTA CHEST FINDINGS VASCULAR Aorta: Satisfactory opacification of the aorta. Normal contour and caliber of the thoracic aorta. No evidence of aneurysm, dissection, or other acute aortic pathology. Cardiovascular: No evidence of pulmonary embolism on limited non-tailored examination. Normal heart size. No pericardial effusion. Review of the MIP images confirms the above findings. NON VASCULAR Mediastinum/Nodes: No enlarged mediastinal, hilar, or axillary lymph nodes. Thymic remnant in the anterior mediastinum. Thyroid gland, trachea, and esophagus demonstrate no significant findings. Lungs/Pleura: Small benign pneumatocele of the left lung base, sequelae of prior infection or inflammation. No acute airspace disease. No pleural effusion or pneumothorax. Musculoskeletal: Mild pectus deformity.  No acute  osseous findings. Review of the MIP images confirms the above findings. CTA ABDOMEN AND PELVIS FINDINGS VASCULAR Normal contour and caliber of the abdominal aorta. No evidence of aneurysm, dissection, or other acute aortic pathology. 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 Review of the MIP images confirms the above findings. NON-VASCULAR Hepatobiliary: No solid liver abnormality is seen. No gallstones, gallbladder wall thickening, or biliary dilatation. Pancreas: Unremarkable. No pancreatic ductal dilatation or surrounding inflammatory changes. Spleen: Normal in size without significant abnormality. Adrenals/Urinary Tract: Adrenal glands are unremarkable. Kidneys are normal, without renal calculi, solid lesion, or hydronephrosis. Bladder is unremarkable. Stomach/Bowel: Stomach is within normal limits. Appendix appears normal. No evidence of bowel wall thickening, distention, or inflammatory changes. Lymphatic: No enlarged abdominal or pelvic lymph nodes. Reproductive: No mass or other significant  abnormality. Other: No abdominal wall hernia or abnormality. No ascites. Musculoskeletal: No acute osseous findings. IMPRESSION: 1. Normal contour and caliber of the thoracic and abdominal aorta. No evidence of aneurysm, dissection, or other acute aortic pathology. No significant atherosclerosis. 2. No acute CT findings of the chest, abdomen, or pelvis. 3. Mild pectus deformity. Electronically Signed   By: Jearld Lesch M.D.   On: 10/25/2023 05:35   DG Chest 2 View Result Date: 10/25/2023 CLINICAL DATA:  Chest pain EXAM: CHEST - 2 VIEW COMPARISON:  06/12/2023 FINDINGS: Normal cardiomediastinal silhouette. No focal consolidation, pleural effusion, or pneumothorax. No displaced rib fractures. Scoliosis. IMPRESSION: No acute cardiopulmonary disease. Electronically Signed   By: Minerva Fester M.D.   On: 10/25/2023 02:48     Procedures .Critical Care  Performed by: Darrick Grinder, PA-C Authorized by: Darrick Grinder, PA-C   Critical care provider statement:    Critical care time (minutes):  40   Critical care was necessary to treat or prevent imminent or life-threatening deterioration of the following conditions:  Cardiac failure   Critical care was time spent personally by me on the following activities:  Development of treatment plan with patient or surrogate, discussions with consultants, evaluation of patient's response to treatment, examination of patient, ordering and review of laboratory studies, ordering and review of radiographic studies, ordering and performing treatments and interventions, pulse oximetry, re-evaluation of patient's condition and review of old charts   Care discussed with: admitting provider       Medications Ordered in ED Medications  aspirin tablet 325 mg (325 mg Oral Given 10/25/23 0412)  morphine (PF) 2 MG/ML injection 2 mg (2 mg Intravenous Given 10/25/23 0413)  ondansetron (ZOFRAN) injection 4 mg (4 mg Intravenous Given 10/25/23 0412)  iohexol (OMNIPAQUE) 350 MG/ML injection 75 mL (75 mLs Intravenous Contrast Given 10/25/23 0510)    ED Course/ Medical Decision Making/ A&P Clinical Course as of 10/25/23 0650  Fri Oct 25, 2023  0637 Here for chest pain. If 3rd trop is normal, likely dc. She does have a cardiologist. If elevated needs cardiology clinic.  [JR]    Clinical Course User Index [JR] Gareth Eagle, PA-C                                 Medical Decision Making Amount and/or Complexity of Data Reviewed Labs: ordered. Radiology: ordered.  Risk OTC drugs. Prescription drug management.   This patient presents to the ED for concern of chest pain, this involves an extensive number of treatment options, and is a complaint that carries with it a high risk of complications and morbidity.  The differential diagnosis includes ACS, pulmonary embolism,  dissection, pneumonia, musculoskeletal pain, others   Co morbidities that complicate the patient evaluation  History of chest pain   Additional history obtained:  Additional history obtained from family bedside External records from outside source obtained and reviewed including cardiology notes   Lab Tests:  I Ordered, and personally interpreted labs.  The pertinent results include: Initial troponin 345, 2nd troponin 4, 3rd troponin 6   Imaging Studies ordered:  I ordered imaging studies including chest x-ray, CT angio chest abdomen pelvis dissection study I independently visualized and interpreted imaging which showed no acute findings chest x-ray CT scan shows:  1. Normal contour and caliber of the thoracic and abdominal aorta.  No evidence of aneurysm, dissection, or other acute aortic  pathology. No significant atherosclerosis.  2. No acute CT findings of the chest, abdomen, or pelvis.  3. Mild pectus deformity.   I agree with the radiologist interpretation   Cardiac Monitoring: / EKG:  The patient was maintained on a cardiac monitor.  I personally viewed and interpreted the cardiac monitored which showed an underlying rhythm of: Sinus tachycardia   Problem List / ED Course / Critical interventions / Medication management   I ordered medication including aspirin for chest pain, morphine for pain, Zofran for nausea Reevaluation of the patient after these medicines showed that the patient improved I have reviewed the patients home medicines and have made adjustments as needed   Social Determinants of Health:  Not a significant factor during this encounter   Test / Admission - Considered:  CT scan unremarkable, chest x-ray also unremarkable.  Initial troponin was elevated but subsequent to troponins were both within normal limits.  Believe the first specimen may have been erroneous.  Patient's pain controlled with medication.  She no longer complains of  shortness of breath.  We now have negative troponins x 2 with a nonischemic EKG and unremarkable CT dissection study.  At this time I see no indication for further emergent workup.  The patient has a cardiologist she follows with and I recommend she follow up with them for further evaluation.  Patient was provided with return precautions including repeat shortness of breath or chest pain that would require further emergent valuation.         Final Clinical Impression(s) / ED Diagnoses Final diagnoses:  Chest pain, unspecified type    Rx / DC Orders ED Discharge Orders     None         Pamala Duffel 10/25/23 0650    Sloan Leiter, DO 10/25/23 0740

## 2023-10-25 NOTE — ED Notes (Signed)
Trop level of 345 given to Dr. Wallace Cullens

## 2024-03-20 ENCOUNTER — Encounter: Payer: Self-pay | Admitting: Radiology

## 2024-03-20 ENCOUNTER — Other Ambulatory Visit (HOSPITAL_COMMUNITY)
Admission: RE | Admit: 2024-03-20 | Discharge: 2024-03-20 | Disposition: A | Discharge status: Home / Self Care | Source: Ambulatory Visit | Attending: Radiology | Admitting: Radiology

## 2024-03-20 ENCOUNTER — Ambulatory Visit (INDEPENDENT_AMBULATORY_CARE_PROVIDER_SITE_OTHER): Payer: Self-pay | Admitting: Radiology

## 2024-03-20 VITALS — BP 98/58 | HR 124 | Ht 63.5 in | Wt 125.8 lb

## 2024-03-20 DIAGNOSIS — N912 Amenorrhea, unspecified: Secondary | ICD-10-CM | POA: Diagnosis not present

## 2024-03-20 DIAGNOSIS — Z01419 Encounter for gynecological examination (general) (routine) without abnormal findings: Secondary | ICD-10-CM | POA: Diagnosis present

## 2024-03-20 DIAGNOSIS — Z113 Encounter for screening for infections with a predominantly sexual mode of transmission: Secondary | ICD-10-CM | POA: Diagnosis not present

## 2024-03-20 DIAGNOSIS — Z3041 Encounter for surveillance of contraceptive pills: Secondary | ICD-10-CM | POA: Diagnosis not present

## 2024-03-20 DIAGNOSIS — Z1331 Encounter for screening for depression: Secondary | ICD-10-CM | POA: Diagnosis not present

## 2024-03-20 LAB — PREGNANCY, URINE: Preg Test, Ur: NEGATIVE

## 2024-03-20 MED ORDER — JUNEL FE 24 1-20 MG-MCG(24) PO TABS: 1.0000 | ORAL_TABLET | Freq: Every day | ORAL | 4 refills | Status: AC

## 2024-03-20 NOTE — Progress Notes (Signed)
   Anita [name] 12-Apr-2001 [id number]   History:  23 y.o. G0 presents for annual exam. Would like a UPT, having some cramping. No missed pills.   Gynecologic History Patient's last menstrual period was 02/13/2024 (exact date). Period Cycle (Days):  (skipped period with COC) Period Pattern: (!) Irregular Menstrual Flow: Moderate, Light Menstrual Control: Thin pad, Maxi pad, Tampon Dysmenorrhea: (!) Mild Dysmenorrhea Symptoms: Cramping Contraception/Family planning: OCP (estrogen/progesterone) Sexually active: yes Last Pap: never  Obstetric History OB History  Gravida Para Term Preterm AB Living  0 0 0 0 0 0  SAB IAB Ectopic Multiple Live Births  0 0 0 0 0       03/20/2024    9:09 AM  Depression screen PHQ 2/9  Decreased Interest 0  Down, Depressed, Hopeless 0  PHQ - 2 Score 0     The following portions of the patient's history were reviewed and updated as appropriate: allergies, current medications, past family history, past medical history, past social history, past surgical history, and problem list.  ROS  Past medical history, past surgical history, family history and social history were all reviewed and documented in the EPIC chart.  Exam:  Vitals:   03/20/24 0908  BP: (!) 98/58  Pulse: (!) 124  SpO2: 99%  Weight: 125 lb 12.8 oz (57.1 kg)  Height: 5' 3.5 (1.613 m)   Body mass index is 21.93 kg/m.  Physical Exam   Ellis Guys, CMA present for exam  Assessment/Plan:   1. Well woman exam with routine gynecological exam (Primary) - Cytology - PAP( [place])  2. Screening for STDs (sexually transmitted diseases) - Cytology - PAP( [place])  3. Amenorrhea - Pregnancy, urine; neg  4. Oral contraceptive pill surveillance - Norethindrone Acetate-Ethinyl Estrad-FE (JUNEL  FE 24) 1-20 MG-MCG(24) tablet; Take 1 tablet by mouth daily.  Dispense: 84 tablet; Refill: 4  5. Depression screening negative     Return in about 1 year (around 03/20/2025)  for Annual.  Laine Piggs WHNP-BC 9:23 AM 03/20/2024

## 2024-03-20 NOTE — Patient Instructions (Signed)

## 2024-03-24 ENCOUNTER — Ambulatory Visit: Payer: Self-pay | Admitting: Radiology

## 2024-03-24 LAB — CYTOLOGY - PAP
Chlamydia: NEGATIVE
Comment: NEGATIVE
Comment: NEGATIVE
Comment: NORMAL
Diagnosis: NEGATIVE
Neisseria Gonorrhea: NEGATIVE
Trichomonas: NEGATIVE

## 2024-03-28 ENCOUNTER — Other Ambulatory Visit: Payer: Self-pay

## 2024-03-28 ENCOUNTER — Emergency Department (HOSPITAL_COMMUNITY)
Admission: EM | Admit: 2024-03-28 | Discharge: 2024-03-28 | Disposition: A | Discharge status: Home / Self Care | Attending: Emergency Medicine | Admitting: Emergency Medicine

## 2024-03-28 ENCOUNTER — Encounter (HOSPITAL_COMMUNITY): Payer: Self-pay

## 2024-03-28 DIAGNOSIS — R202 Paresthesia of skin: Secondary | ICD-10-CM | POA: Insufficient documentation

## 2024-03-28 DIAGNOSIS — D649 Anemia, unspecified: Secondary | ICD-10-CM | POA: Diagnosis not present

## 2024-03-28 LAB — CBC
HCT: 33.6 % — ABNORMAL LOW (ref 36.0–46.0)
Hemoglobin: 10 g/dL — ABNORMAL LOW (ref 12.0–15.0)
MCH: 19.2 pg — ABNORMAL LOW (ref 26.0–34.0)
MCHC: 29.8 g/dL — ABNORMAL LOW (ref 30.0–36.0)
MCV: 64.5 fL — ABNORMAL LOW (ref 80.0–100.0)
Platelets: 273 10*3/uL (ref 150–400)
RBC: 5.21 MIL/uL — ABNORMAL HIGH (ref 3.87–5.11)
RDW: 14.6 % (ref 11.5–15.5)
WBC: 8.4 10*3/uL (ref 4.0–10.5)
nRBC: 0 % (ref 0.0–0.2)

## 2024-03-28 LAB — BASIC METABOLIC PANEL WITH GFR
Anion gap: 8 (ref 5–15)
BUN: 8 mg/dL (ref 6–20)
CO2: 22 mmol/L (ref 22–32)
Calcium: 9.3 mg/dL (ref 8.9–10.3)
Chloride: 107 mmol/L (ref 98–111)
Creatinine, Ser: 0.51 mg/dL (ref 0.44–1.00)
GFR, Estimated: >60 mL/min (ref >60)
Glucose, Bld: 85 mg/dL (ref 70–99)
Potassium: 3.7 mmol/L (ref 3.5–5.1)
Sodium: 137 mmol/L (ref 135–145)

## 2024-03-28 NOTE — ED Triage Notes (Addendum)
 Pt to ED via POV c/o bilat arm numbness and tingling intermittent in nature x 2 years and also bilat leg numbness and tingling intermittent x 3 days. Reports no alleviating or aggravating factors, evaluated for in the past and started on medication for anxiety, ambulatory in triage. NAD

## 2024-03-28 NOTE — ED Provider Triage Note (Signed)
 Emergency Medicine Provider Triage Evaluation Note  Anita [name] , a 23 y.o. female  was evaluated in triage.  Pt complains of numbness. States her arms have been numb for 2 years, 4 days ago she woke up and her legs were numb. States she has been seen and evaluated for her arm numbness several times without a clear cause.  The numbness in her legs was not gradual.  Denies any pain in her legs.  Does note that they feel mildly weaker than normal.  Denies any trauma or overuse.  No recent exercise.  No fevers or chills.  No new back pain.  No loss of bowel or bladder function or saddle anesthesia.  No fevers, chills, nausea, or vomiting.  Review of Systems  Positive:  Negative:   Physical Exam  BP 133/87   Pulse (!) 108   Temp 98.3 F (36.8 C) (Oral)   Resp 18   Ht 5' 3 (1.6 m)   Wt 56.7 kg   LMP 03/26/2024 (Exact Date)   SpO2 100%   BMI 22.14 kg/m  Gen:   Awake, no distress  well appearing Resp:  Normal effort  MSK:   Moves extremities without difficulty  Other:  Ambulatory, 5/5 strength to bilateral lower extremities. Subjective bilateral sensation change. No abdominal tenderness to back tenderness to palpation  Medical Decision Making  Medically screening exam initiated at 11:43 AM.  Appropriate orders placed.  Anita [name] was informed that the remainder of the evaluation will be completed by another provider, this initial triage assessment does not replace that evaluation, and the importance of remaining in the ED until their evaluation is complete.     Nora Lauraine LABOR, PA-C 03/28/24 1152

## 2024-03-28 NOTE — Discharge Instructions (Signed)
 You were evaluated in the emergency room for numbness and tingling.  You were provided  a neurology referral.  You should be expecting a call within the next few days.  If you experience any new or worsening symptoms please return to emergency room.

## 2024-03-28 NOTE — ED Provider Notes (Signed)
 [facility] Provider Note   CSN: [id number] Arrival date & time: 03/28/24  1109     Patient presents with: Leg Problem, Numbness, and Arm Problem   Anita [name] is a 23 y.o. female with history of anxiety and anemia presents with complaints of 2 years of bilateral arm numbness and tingling with now having bilateral lower extremity numbness and tingling.  Her newer symptoms started within the past 3 days.  No difficulty walking.  No urinary symptoms.  No back pain.  She has no history of blood clots.  No recent travel, surgery or extended hospitalization.  She is on birth control however.  She states she has been seen in the past for her numbness and tingling as well as chest pain that she has had in the past.  Overall the workup has been reassuring and she was told her symptoms were likely just anxiety.   HPI    Past Medical History:  Diagnosis Date   Anemia    Anxiety      Prior to Admission medications   Medication Sig Start Date End Date Taking? Authorizing Provider  metroNIDAZOLE  (FLAGYL ) 500 MG tablet Take 1 tablet (500 mg total) by mouth 2 (two) times daily. 04/22/23   Prentiss Annabella LABOR, NP  naproxen  (NAPROSYN ) 500 MG tablet Take by mouth. 03/12/23   [provider]  Norethindrone Acetate-Ethinyl Estrad-FE (JUNEL  FE 24) 1-20 MG-MCG(24) tablet Take 1 tablet by mouth daily. 03/20/24   Chrzanowski, Jami B, NP    Allergies: Amoxicillin    Review of Systems  Neurological:  Positive for numbness.    Updated Vital Signs BP 121/79 (BP Location: Right Arm)   Pulse 80   Temp 98.1 F (36.7 C) (Oral)   Resp 16   Ht 5' 3 (1.6 m)   Wt 56.7 kg   LMP 03/26/2024 (Exact Date)   SpO2 100%   BMI 22.14 kg/m   Physical Exam Vitals and nursing note reviewed.  Constitutional:      General: She is not in acute distress.    Appearance: She is well-developed.  HENT:     Head: Normocephalic and atraumatic.   Eyes:      Conjunctiva/sclera: Conjunctivae normal.    Cardiovascular:     Rate and Rhythm: Normal rate and regular rhythm.     Heart sounds: No murmur heard. Pulmonary:     Effort: Pulmonary effort is normal. No respiratory distress.     Breath sounds: Normal breath sounds.  Abdominal:     Palpations: Abdomen is soft.     Tenderness: There is no abdominal tenderness.   Musculoskeletal:        General: No swelling.     Cervical back: Neck supple.   Skin:    General: Skin is warm and dry.     Capillary Refill: Capillary refill takes less than 2 seconds.   Neurological:     Mental Status: She is alert.     Comments: Patient is alert and oriented. There is no abnormal phonation. Symmetric smile without facial droop.  Moves all extremities spontaneously. 5/5 strength in upper and lower extremities. .  Endorses bilateral lower extremity sensation deficit over knees. There is no nystagmus. EOMI, PERRL. Coordination intact with finger to nose and normal ambulation.  Symmetric DTRs.  Ambulates without difficulty   Psychiatric:        Mood and Affect: Mood normal.     (all labs ordered are listed, but only  abnormal results are displayed) Labs Reviewed  CBC - Abnormal; Notable for the following components:      Result Value   RBC 5.21 (*)    Hemoglobin 10.0 (*)    HCT 33.6 (*)    MCV 64.5 (*)    MCH 19.2 (*)    MCHC 29.8 (*)    All other components within normal limits  BASIC METABOLIC PANEL WITH GFR    EKG: None  Radiology: No results found.   Procedures   Medications Ordered in the ED - No data to display                                  Medical Decision Making  This patient presents to the ED with chief complaint(s) of numbness.  The complaint involves an extensive differential diagnosis and also carries with it a high risk of complications and morbidity.   Pertinent past medical history as listed in HPI  The differential diagnosis includes  CVA, TIA, aortic dissection,  MS, Guillain-Barr, cauda equina Additional history obtained: Records reviewed Care Everywhere/External Records  Assessment and management:   Hemodynamically stable, nontoxic-appearing patient presenting with 2 years worth of paresthesias.  This probably involves her upper extremities however in the past week now involves her lower extremities.  Is not associated with any headache, vision changes, dizziness, chest pain, shortness of breath.  She has been evaluated for this in the past, was felt to be consistent with her anxiety.  She has had cardiac workup as well which has been reassuring.  On exam patient does endorse sensation deficits in her bilateral lower extremities over her knees.  Otherwise she has no other neurodeficits noted.  Bilateral DTRs symmetric and brisk.  She has no recent viral illness to suggest Guillain-Barr.  She has no saddle anesthesia, urinary or fecal incontinence.  Do not suspect cauda equina.  Offered patient MRI imaging here in the ED versus outpatient neurology follow-up.  Using shared decision making and chronic nature of patient's symptoms we have agreed on discharge home with neurology follow-up.  Independent ECG interpretation:  none  Independent labs interpretation:  The following labs were independently interpreted:  CBC and BMP without significant abnormality  Independent visualization and interpretation of imaging: I independently visualized the following imaging with scope of interpretation limited to determining acute life threatening conditions related to emergency care: none    Consultations obtained:   none  Disposition:   Patient will be discharged home. The patient has been appropriately medically screened and/or stabilized in the ED. I have low suspicion for any other emergent medical condition which would require further screening, evaluation or treatment in the ED or require inpatient management. At time of discharge the patient is  hemodynamically stable and in no acute distress. I have discussed work-up results and diagnosis with patient and answered all questions. Patient is agreeable with discharge plan. We discussed strict return precautions for returning to the emergency department and they verbalized understanding.     Social Determinants of Health:   none  This note was dictated with voice recognition software.  Despite best efforts at proofreading, errors may have occurred which can change the documentation meaning.       Final diagnoses:  Paresthesias    ED Discharge Orders          Ordered    Ambulatory referral to Neurology       Comments: An appointment  is requested in approximately: 1 week   03/28/24 1313               Donnajean Lynwood DEL, PA-C 03/28/24 1332    Emil Share, DO 03/28/24 1523

## 2024-04-04 ENCOUNTER — Encounter (HOSPITAL_COMMUNITY): Payer: Self-pay | Admitting: *Deleted

## 2024-04-04 ENCOUNTER — Emergency Department (HOSPITAL_COMMUNITY)

## 2024-04-04 ENCOUNTER — Emergency Department (HOSPITAL_COMMUNITY)
Admission: EM | Admit: 2024-04-04 | Discharge: 2024-04-04 | Disposition: A | Discharge status: Home / Self Care | Attending: Emergency Medicine | Admitting: Emergency Medicine

## 2024-04-04 DIAGNOSIS — R202 Paresthesia of skin: Secondary | ICD-10-CM

## 2024-04-04 DIAGNOSIS — D72829 Elevated white blood cell count, unspecified: Secondary | ICD-10-CM | POA: Insufficient documentation

## 2024-04-04 DIAGNOSIS — E538 Deficiency of other specified B group vitamins: Secondary | ICD-10-CM | POA: Insufficient documentation

## 2024-04-04 DIAGNOSIS — N390 Urinary tract infection, site not specified: Secondary | ICD-10-CM | POA: Insufficient documentation

## 2024-04-04 LAB — URINALYSIS, ROUTINE W REFLEX MICROSCOPIC
Bilirubin Urine: NEGATIVE
Glucose, UA: NEGATIVE mg/dL
Ketones, ur: 5 mg/dL — AB
Nitrite: NEGATIVE
Protein, ur: NEGATIVE mg/dL
Specific Gravity, Urine: 1.019 (ref 1.005–1.030)
pH: 7 (ref 5.0–8.0)

## 2024-04-04 LAB — COMPREHENSIVE METABOLIC PANEL WITH GFR
ALT: 18 U/L (ref 0–44)
AST: 16 U/L (ref 15–41)
Albumin: 4 g/dL (ref 3.5–5.0)
Alkaline Phosphatase: 44 U/L (ref 38–126)
Anion gap: 10 (ref 5–15)
BUN: 6 mg/dL (ref 6–20)
CO2: 20 mmol/L — ABNORMAL LOW (ref 22–32)
Calcium: 9.4 mg/dL (ref 8.9–10.3)
Chloride: 105 mmol/L (ref 98–111)
Creatinine, Ser: 0.6 mg/dL (ref 0.44–1.00)
GFR, Estimated: >60 mL/min
Glucose, Bld: 79 mg/dL (ref 70–99)
Potassium: 3.9 mmol/L (ref 3.5–5.1)
Sodium: 135 mmol/L (ref 135–145)
Total Bilirubin: 0.8 mg/dL (ref 0.0–1.2)
Total Protein: 7.5 g/dL (ref 6.5–8.1)

## 2024-04-04 LAB — CBC
HCT: 32 % — ABNORMAL LOW (ref 36.0–46.0)
Hemoglobin: 10 g/dL — ABNORMAL LOW (ref 12.0–15.0)
MCH: 20.1 pg — ABNORMAL LOW (ref 26.0–34.0)
MCHC: 31.3 g/dL (ref 30.0–36.0)
MCV: 64.3 fL — ABNORMAL LOW (ref 80.0–100.0)
Platelets: 306 K/uL (ref 150–400)
RBC: 4.98 MIL/uL (ref 3.87–5.11)
RDW: 14.8 % (ref 11.5–15.5)
WBC: 10.7 K/uL — ABNORMAL HIGH (ref 4.0–10.5)
nRBC: 0 % (ref 0.0–0.2)

## 2024-04-04 LAB — TSH: TSH: 2.695 u[IU]/mL (ref 0.350–4.500)

## 2024-04-04 LAB — RAPID HIV SCREEN (HIV 1/2 AB+AG)
HIV 1/2 Antibodies: NONREACTIVE
HIV-1 P24 Antigen - HIV24: NONREACTIVE

## 2024-04-04 LAB — FOLATE: Folate: 28.7 ng/mL (ref >5.9)

## 2024-04-04 LAB — VITAMIN B12: Vitamin B-12: 97 pg/mL — ABNORMAL LOW (ref 180–914)

## 2024-04-04 LAB — HCG, SERUM, QUALITATIVE: Preg, Serum: NEGATIVE

## 2024-04-04 MED ORDER — LORAZEPAM 1 MG PO TABS
0.5000 mg | ORAL_TABLET | Freq: Once | ORAL | Status: AC
  Administered 2024-04-04: 0.5 mg via ORAL
  Filled 2024-04-04: qty 1

## 2024-04-04 MED ORDER — GADOBUTROL 1 MMOL/ML IV SOLN
6.0000 mL | Freq: Once | INTRAVENOUS | Status: AC | PRN
  Administered 2024-04-04: 6 mL via INTRAVENOUS

## 2024-04-04 MED ORDER — CYANOCOBALAMIN 1000 MCG/ML IJ SOLN
1000.0000 ug | Freq: Once | INTRAMUSCULAR | Status: AC
  Administered 2024-04-04: 1000 ug via INTRAMUSCULAR
  Filled 2024-04-04: qty 1

## 2024-04-04 MED ORDER — NITROFURANTOIN MONOHYD MACRO 100 MG PO CAPS: 100.0000 mg | ORAL_CAPSULE | Freq: Two times a day (BID) | ORAL | 0 refills | Status: DC

## 2024-04-04 NOTE — ED Notes (Signed)
 Patient transported to MRI

## 2024-04-04 NOTE — ED Triage Notes (Signed)
 The pts legs and arms have been numb and tingling  for 2 years  she has been on meds and has a neurology consult the 18th of this month   she was seen here last week for the same today she has had more problems and her anxiety has been enormous  she cannot control it crying on arrival  very anxious  lmp  last week

## 2024-04-04 NOTE — Discharge Instructions (Addendum)
 Keep your scheduled follow-up with neurology, follow-up with your primary care provider in regard to your B12 deficiency.  Start 1000 mcg vitamin B12 supplement daily, continue your daily multivitamin as well.  Start Macrobid  for suspected urinary tract infection, 1 tablet by mouth twice daily for 5 days.  Return to the emergency department if your symptoms worsen.

## 2024-04-04 NOTE — ED Notes (Signed)
Pt went to MRI.

## 2024-04-04 NOTE — ED Provider Notes (Signed)
 [place] [facility] AT [facility] Provider Note   CSN: [id number] Arrival date & time: 04/04/24  1800     Patient presents with: arm and leg numbness   Anita [name] is a 23 y.o. female.   23 year old female presenting with multiple complaints.  Patient says for last 2 years she has had issues with numbness/tingling/weakness of both upper extremities, for the last 2 weeks she has been experiencing similar symptoms to her lower extremities.  She was seen for this issue approximately 1 week ago and was referred to neurology for outpatient workup, she has appointment with them 7/18.  Patient reports today she was driving and felt that she was having trouble gripping the steering wheel, this scared her and prompted her to come to the emergency department.  She also endorses light sensitivity today that is worse than usual for her.  She has mention these issues to her primary care provider in the past but was told that it was likely secondary to anemia/vitamin deficiencies, she is on iron supplement and vitamin D supplement for this.  She denies fever or injury/inciting event, she does have a history of scoliosis.  When asked about urinary incontinence she mentions that yesterday/today she has gone to the bathroom and felt that her underwear was wet, was unsure if this was from urine or vaginal discharge but says it was abnormal for her, she does endorse mild dysuria today/yesterday.  No saddle anesthesia.        Prior to Admission medications   Medication Sig Start Date End Date Taking? Authorizing Provider  metroNIDAZOLE  (FLAGYL ) 500 MG tablet Take 1 tablet (500 mg total) by mouth 2 (two) times daily. 04/22/23   Prentiss Annabella LABOR, NP  naproxen  (NAPROSYN ) 500 MG tablet Take by mouth. 03/12/23   [provider]  Norethindrone Acetate-Ethinyl Estrad-FE (JUNEL  FE 24) 1-20 MG-MCG(24) tablet Take 1 tablet by mouth daily. 03/20/24   Chrzanowski, Jami B, NP     Allergies: Amoxicillin    Review of Systems  Updated Vital Signs  Vitals:   04/04/24 1812 04/04/24 1815 04/04/24 2232 04/04/24 2325  BP:  (!) 142/84 124/74 126/78  Pulse:  (!) 113 98 78  Resp:  20 17 16   Temp:  99.4 F (37.4 C) 98.9 F (37.2 C)   TempSrc:   Oral   SpO2:  100% 100% 100%  Weight: 56.7 kg     Height: 5' 3 (1.6 m)        Physical Exam Vitals and nursing note reviewed.  HENT:     Head: Normocephalic.  Eyes:     Extraocular Movements: Extraocular movements intact.     Pupils: Pupils are equal, round, and reactive to light.     Comments: Photophobia   Cardiovascular:     Rate and Rhythm: Normal rate.  Pulmonary:     Effort: Pulmonary effort is normal.  Abdominal:     Palpations: Abdomen is soft.     Tenderness: There is no abdominal tenderness. There is no right CVA tenderness, left CVA tenderness or guarding.  Musculoskeletal:     Cervical back: Normal range of motion and neck supple.     Comments: Diminished strength against resistance specifically with elbow flexion and dorsiflexion/plantarflexion L>R  No tenderness to palpation of C-spine/T-spine/L-spine, no palpable step-offs or deformity  Skin:    General: Skin is warm and dry.  Neurological:     Mental Status: She is alert and oriented to person, place, and time.  Sensory: No sensory deficit.     Comments: Facial expressions intact and symmetric without evidence of facial droop Grip strength diminished bilaterally, left > right No sensory deficits of bilateral upper or lower extremities Ambulates on her own without difficulty     (all labs ordered are listed, but only abnormal results are displayed) Labs Reviewed  COMPREHENSIVE METABOLIC PANEL WITH GFR - Abnormal; Notable for the following components:      Result Value   CO2 20 (*)    All other components within normal limits  CBC - Abnormal; Notable for the following components:   WBC 10.7 (*)    Hemoglobin 10.0 (*)    HCT 32.0  (*)    MCV 64.3 (*)    MCH 20.1 (*)    All other components within normal limits  URINALYSIS, ROUTINE W REFLEX MICROSCOPIC - Abnormal; Notable for the following components:   Hgb urine dipstick SMALL (*)    Ketones, ur 5 (*)    Leukocytes,Ua TRACE (*)    Bacteria, UA RARE (*)    All other components within normal limits  VITAMIN B12 - Abnormal; Notable for the following components:   Vitamin B-12 97 (*)    All other components within normal limits  URINE CULTURE  HCG, SERUM, QUALITATIVE  FOLATE  TSH  RAPID HIV SCREEN (HIV 1/2 AB+AG)  VITAMIN B1  VITAMIN B6  RPR  INTRINSIC FACTOR ANTIBODIES    EKG: None  Radiology: MR Cervical Spine W and Wo Contrast Result Date: 04/04/2024 CLINICAL DATA:  Initial evaluation for acute paresthesias, weakness. EXAM: MRI CERVICAL SPINE WITHOUT AND WITH CONTRAST TECHNIQUE: Multiplanar and multiecho pulse sequences of the cervical spine, to include the craniocervical junction and cervicothoracic junction, were obtained without and with intravenous contrast. CONTRAST:  6mL GADAVIST  GADOBUTROL  1 MMOL/ML IV SOLN COMPARISON:  None Available. FINDINGS: Alignment: Smooth reversal of the normal cervical lordosis. No listhesis. Vertebrae: Vertebral body height maintained without acute or chronic fracture. Bone marrow signal intensity within normal limits. No worrisome osseous lesions. No abnormal marrow edema or enhancement. Cord: Normal signal and morphology. Apparent focus of cord signal abnormality involving the upper cord at the level of C2 on axial T2 weighted sequence (series 9, image 8), not seen on corresponding sequences, and felt to be consistent with artifact. No abnormal enhancement. Posterior Fossa, vertebral arteries, paraspinal tissues: Unremarkable. Disc levels: C2-C3: Unremarkable. C3-C4:  Disc desiccation with minimal annular bulge.  No stenosis. C4-C5:  Disc desiccation with minimal annular bulge.  No stenosis. C5-C6:  Disc desiccation with minimal  annular bulge.  No stenosis. C6-C7:  Disc desiccation with minimal annular bulge.  No stenosis. C7-T1: Tiny central disc protrusion indents the ventral thecal sac. No stenosis. IMPRESSION: 1. Normal MRI appearance of the cervical spinal cord. 2. Minor/early spondylosis as above without significant stenosis or impingement. Electronically Signed   By: Morene Hoard M.D.   On: 04/04/2024 22:28   MR Brain W and Wo Contrast Result Date: 04/04/2024 CLINICAL DATA:  Initial evaluation for paresthesias, weakness. EXAM: MRI HEAD WITHOUT AND WITH CONTRAST TECHNIQUE: Multiplanar, multiecho pulse sequences of the brain and surrounding structures were obtained without and with intravenous contrast. CONTRAST:  6mL GADAVIST  GADOBUTROL  1 MMOL/ML IV SOLN COMPARISON:  CT from earlier the same day. FINDINGS: Brain: Cerebral volume within normal limits for age. No focal parenchymal signal abnormality. No abnormal foci of restricted diffusion to suggest acute or subacute ischemia. Gray-white matter differentiation well maintained. No encephalomalacia to suggest chronic cortical infarction or  other insult. No foci of susceptibility artifact indicative of acute or chronic intracranial blood products. No mass lesion, midline shift or mass effect. Ventricles normal in size and morphology without hydrocephalus. No extra-axial fluid collection. Pituitary gland and suprasellar region within normal limits. No abnormal enhancement. Vascular: Major intracranial vascular flow voids are well maintained. Skull and upper cervical spine: Craniocervical junction within normal limits. Visualized upper cervical spine demonstrates no significant finding. Bone marrow signal intensity within normal limits. No scalp soft tissue abnormality. Sinuses/Orbits: Globes and orbital soft tissues are within normal limits. Paranasal sinuses are largely clear. No significant mastoid effusion. Other: None. IMPRESSION: Normal brain MRI. No acute intracranial  abnormality. Electronically Signed   By: Morene Hoard M.D.   On: 04/04/2024 22:23   CT Head Wo Contrast Result Date: 04/04/2024 CLINICAL DATA:  Weakness light sensitivity EXAM: CT HEAD WITHOUT CONTRAST TECHNIQUE: Contiguous axial images were obtained from the base of the skull through the vertex without intravenous contrast. RADIATION DOSE REDUCTION: This exam was performed according to the departmental dose-optimization program which includes automated exposure control, adjustment of the mA and/or kV according to patient size and/or use of iterative reconstruction technique. COMPARISON:  None Available. FINDINGS: Brain: No evidence of acute infarction, hemorrhage, hydrocephalus, extra-axial collection or mass lesion/mass effect. Vascular: No hyperdense vessel or unexpected calcification. Skull: Normal. Negative for fracture or focal lesion. Sinuses/Orbits: No acute finding. Other: None IMPRESSION: Negative non contrasted CT appearance of the brain. Electronically Signed   By: Luke Bun M.D.   On: 04/04/2024 20:19     Procedures   Medications Ordered in the ED  LORazepam  (ATIVAN ) tablet 0.5 mg (0.5 mg Oral Given 04/04/24 2040)  gadobutrol  (GADAVIST ) 1 MMOL/ML injection 6 mL (6 mLs Intravenous Contrast Given 04/04/24 2154)  cyanocobalamin  (VITAMIN B12) injection 1,000 mcg (1,000 mcg Intramuscular Given 04/04/24 2249)                                    Medical Decision Making This patient presents to the ED for concern of paresthesias/numbness/weakness, this involves an extensive number of treatment options, and is a complaint that carries with it a high risk of complications and morbidity.  The differential diagnosis includes vitamin deficiency, intracranial mass, multiple sclerosis, anemia.    Co morbidities that complicate the patient evaluation  Scoliosis   Additional history obtained:  Additional history obtained from record review External records from outside source obtained  and reviewed including prior ED note   Lab Tests:  I Ordered, and personally interpreted labs.  The pertinent results include: CBC notable for mild leukocytosis with signs of microcytic anemia with a hemoglobin of 10, however this is reflective of patient's most recent lab results from a week ago.  Urinalysis notable for small RBCs, ketones, trace leukocytes with rare bacteria but negative nitrites.  Patient did endorse mild dysuria today, will send for culture.  CMP unremarkable.  Serum hCG negative.  B12 low at 97.  HIV negative.  Labs pending at time of discharge include: Intrinsic factor antibodies, vitamin B1 level, vitamin B6 level, RPR, TSH.   Imaging Studies ordered:  I ordered imaging studies including CT head without contast, MRI brain and C-spine with and without contrast I independently visualized and interpreted imaging which showed  - head CT: Negative non contrasted CT appearance of the brain. - MRI brain: Normal brain MRI. No acute intracranial abnormality.  - MRI C spine: 1.  Normal MRI appearance of the cervical spinal cord. 2. Minor/early spondylosis as above without significant stenosis or impingement.  I agree with the radiologist interpretation   Cardiac Monitoring: / EKG:  The patient was maintained on a cardiac monitor.  I personally viewed and interpreted the cardiac monitored which showed an underlying rhythm of: NSR   Consultations Obtained:  I requested consultation with the neurologist,  and discussed lab and imaging findings as well as pertinent plan - they recommend: Spoke with Dr. Khaliqdina who recommends further work-up given above findings, including: MRI brain and C-spine with and without contrast, vitamin levels including B1/B6/B12/folic, TSH, HIV/RPR.   Spoke with Dr. Khaliqdina again once patient's MRI results and B12 had resulted, he recommends IM B12 prior to discharge, have patient start on 1000 mcg B12 supplement daily as well as multivitamin.   Will order intrinsic factor antibody to assess for pernicious anemia.  Keep scheduled appointment with neurology and follow-up with primary care provider for review of pending labs and management of B12 deficiency.   Problem List / ED Course / Critical interventions / Medication management   I ordered medication including ativan   for anxiety, IM B12 for B12 deficiency  Reevaluation of the patient after these medicines showed that the patient improved I have reviewed the patients home medicines and have made adjustments as needed   Social Determinants of Health:  Former tobacco use   Test / Admission - Considered:  Physical exam notable as above.  Patient was seen 1 week ago for similar complaints, CT/MRI imaging was not performed at that time.  CT ordered, see above results.  I discussed this case with neurology, spoke with Dr. Khaliqdina, see above for their recommendations.  See above for MRI findings.  Patient found to be significantly deficient in B12, IM B12 administered prior to discharge.  Encouraged patient to continue multivitamin but add 1000 mcg B12 daily based on recommendations from neurology as above, follow-up with neurology as previously scheduled and follow-up with primary care provider in regard to B12 deficiency and for follow-up on pending labs. I advised the patient that she will be able to view the results of these labs in MyChart once they are resulted.  Patient does endorse 2 days of dysuria, see above for urinalysis findings.  No fever or CVA tenderness on exam. Will start patient on Macrobid  given documented amoxicillin allergy/unknown if patient has tolerated cephalosporins in the past, urine culture pending.  Discussed these findings in depth with patient and her partner, they voiced understanding and are in agreement this plan.  Return precautions discussed.  She is appropriate for discharge at this time.    Amount and/or Complexity of Data Reviewed Labs:  ordered. Radiology: ordered.  Risk Prescription drug management.        Final diagnoses:  Paresthesias  B12 deficiency  Urinary tract infection without hematuria, site unspecified    ED Discharge Orders          Ordered    nitrofurantoin , macrocrystal-monohydrate, (MACROBID ) 100 MG capsule  2 times daily        04/04/24 2318               Glendia Rocky SAILOR, PA-C 04/04/24 2332    Geraldene Glendia, MD 04/05/24 2224

## 2024-04-05 LAB — URINE CULTURE: Culture: <10000 — AB

## 2024-04-05 LAB — RPR: RPR Ser Ql: NONREACTIVE

## 2024-04-07 LAB — INTRINSIC FACTOR ANTIBODIES: Intrinsic Factor: 20.7 [AU]/ml — ABNORMAL HIGH (ref 0.0–1.1)

## 2024-04-08 LAB — VITAMIN B1: Vitamin B1 (Thiamine): 112.4 nmol/L (ref 66.5–200.0)

## 2024-04-08 LAB — VITAMIN B6: Vitamin B6: 7.9 ug/L (ref 3.4–65.2)

## 2024-04-17 ENCOUNTER — Encounter: Payer: Self-pay | Admitting: Neurology

## 2024-04-17 ENCOUNTER — Ambulatory Visit (INDEPENDENT_AMBULATORY_CARE_PROVIDER_SITE_OTHER): Admitting: Neurology

## 2024-04-17 VITALS — BP 108/67 | HR 71 | Ht 62.0 in | Wt 125.0 lb

## 2024-04-17 DIAGNOSIS — E538 Deficiency of other specified B group vitamins: Secondary | ICD-10-CM

## 2024-04-17 DIAGNOSIS — D510 Vitamin B12 deficiency anemia due to intrinsic factor deficiency: Secondary | ICD-10-CM

## 2024-04-17 DIAGNOSIS — G629 Polyneuropathy, unspecified: Secondary | ICD-10-CM | POA: Diagnosis not present

## 2024-04-17 DIAGNOSIS — R7309 Other abnormal glucose: Secondary | ICD-10-CM

## 2024-04-17 DIAGNOSIS — G63 Polyneuropathy in diseases classified elsewhere: Secondary | ICD-10-CM

## 2024-04-17 DIAGNOSIS — H547 Unspecified visual loss: Secondary | ICD-10-CM

## 2024-04-17 MED ORDER — GABAPENTIN 300 MG PO CAPS: 300.0000 mg | ORAL_CAPSULE | Freq: Three times a day (TID) | ORAL | 11 refills | Status: AC | PRN

## 2024-04-17 MED ORDER — CYANOCOBALAMIN 1000 MCG/ML IJ SOLN
1000.0000 ug | Freq: Once | INTRAMUSCULAR | Status: AC
  Administered 2024-04-17: 1000 ug via INTRAMUSCULAR

## 2024-04-17 NOTE — Patient Instructions (Signed)
 Vitamin B12 deficiency can lead to small fiber neuropathy, a condition characterized by damage to the small nerve fibers in the body. This damage can cause a variety of symptoms, including pain, numbness, and tingling in the extremities. B12 deficiency is one of the recognized causes of this type of neuropathy, alongside other conditions like diabetes, autoimmune diseases, and certain genetic predispositions.  Understanding Small Fiber Neuropathy  Small fiber neuropathy specifically affects the small-diameter nerve fibers, including both myelinated A-delta fibers and unmyelinated C fibers. These fibers play a crucial role in transmitting pain and temperature sensations, as well as regulating autonomic functions.  Vitamin B12 and Neuropathy Vitamin B12 is essential for maintaining nerve health, and a deficiency can disrupt nerve function, leading to neuropathy. Specifically, B12 deficiency can cause:  Demyelination: SABRA Vitamin B12 is crucial for the formation and maintenance of myelin, the protective sheath surrounding nerve fibers. Deficiency can lead to demyelination, disrupting nerve signal transmission.  Axonal Damage: . Symptoms of B12 Deficiency-Related Small Fiber Neuropathy  Symptoms can vary, but often include: Pain: Burning, tingling, or shooting pain in the extremities, particularly the feet and hands.  Numbness and Tingling: A loss of sensation or a pins and needles sensation in the affected areas.  Sensory Disturbances: Changes in how the body perceives temperature, touch, and vibration.  Autonomic Dysfunction: In some cases, B12 deficiency can also affect the autonomic nervous system, leading to symptoms like dizziness, digestive issues, or problems with temperature regulation.
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
## 2024-04-18 LAB — HEMOGLOBIN A1C
Est. average glucose Bld gHb Est-mCnc: 94 mg/dL
Hgb A1c MFr Bld: 4.9 % (ref 4.8–5.6)

## 2024-04-20 ENCOUNTER — Encounter: Payer: Self-pay | Admitting: Neurology

## 2024-04-20 ENCOUNTER — Ambulatory Visit: Payer: Self-pay | Admitting: Neurology

## 2024-04-20 DIAGNOSIS — E538 Deficiency of other specified B group vitamins: Secondary | ICD-10-CM | POA: Insufficient documentation

## 2024-04-20 DIAGNOSIS — D510 Vitamin B12 deficiency anemia due to intrinsic factor deficiency: Secondary | ICD-10-CM | POA: Insufficient documentation

## 2024-04-20 MED ORDER — CYANOCOBALAMIN 1000 MCG/ML IJ SOLN: 1000.0000 ug | INTRAMUSCULAR | 0 refills | Status: DC

## 2024-04-20 MED ORDER — BD SAFETYGLIDE SYRINGE/NEEDLE 25G X 1" 3 ML MISC: 11 refills | Status: AC

## 2024-04-21 ENCOUNTER — Telehealth: Payer: Self-pay | Admitting: Neurology

## 2024-04-21 NOTE — Telephone Encounter (Signed)
 Referral for ophthalmology fax to [facility]. Phone: [phone], Fax: [phone]

## 2024-04-28 ENCOUNTER — Encounter: Payer: Self-pay | Admitting: Neurology

## 2024-05-11 ENCOUNTER — Other Ambulatory Visit: Payer: Self-pay | Admitting: Neurology

## 2024-05-11 DIAGNOSIS — D510 Vitamin B12 deficiency anemia due to intrinsic factor deficiency: Secondary | ICD-10-CM

## 2024-05-11 DIAGNOSIS — E538 Deficiency of other specified B group vitamins: Secondary | ICD-10-CM

## 2024-05-12 ENCOUNTER — Telehealth: Payer: Self-pay | Admitting: Neurology

## 2024-05-12 NOTE — Telephone Encounter (Signed)
 Pt called  to ask MD is it okay to travel Pt is suppose to be leaving Sunday out of town however Pt has new Pcp and not will be able to speak to New PCP before leaving and was asking MD was I t okay to travel .

## 2024-05-12 NOTE — Telephone Encounter (Signed)
 As far as the b12 deficiency and numbness and tingling, if she is feeling well I don;t have any objections but I can't speak for rheumatology or primary care for any other conditions thank you

## 2024-05-13 NOTE — Telephone Encounter (Signed)
 I called and LMVM for pt regarding Dr. Sharion note below as pt has not read her mychart message.

## 2024-06-06 ENCOUNTER — Other Ambulatory Visit: Payer: Self-pay

## 2024-06-06 ENCOUNTER — Emergency Department (HOSPITAL_BASED_OUTPATIENT_CLINIC_OR_DEPARTMENT_OTHER)
Admission: EM | Admit: 2024-06-06 | Discharge: 2024-06-07 | Disposition: A | Discharge status: Home / Self Care | Attending: Emergency Medicine | Admitting: Emergency Medicine

## 2024-06-06 DIAGNOSIS — R1013 Epigastric pain: Secondary | ICD-10-CM | POA: Insufficient documentation

## 2024-06-06 DIAGNOSIS — R1011 Right upper quadrant pain: Secondary | ICD-10-CM | POA: Diagnosis not present

## 2024-06-06 LAB — CBC
HCT: 33.2 % — ABNORMAL LOW (ref 36.0–46.0)
Hemoglobin: 10.4 g/dL — ABNORMAL LOW (ref 12.0–15.0)
MCH: 19.8 pg — ABNORMAL LOW (ref 26.0–34.0)
MCHC: 31.3 g/dL (ref 30.0–36.0)
MCV: 63.2 fL — ABNORMAL LOW (ref 80.0–100.0)
Platelets: 340 K/uL (ref 150–400)
RBC: 5.25 MIL/uL — ABNORMAL HIGH (ref 3.87–5.11)
RDW: 14.9 % (ref 11.5–15.5)
WBC: 10.1 K/uL (ref 4.0–10.5)
nRBC: 0 % (ref 0.0–0.2)

## 2024-06-06 LAB — COMPREHENSIVE METABOLIC PANEL WITH GFR
ALT: 15 U/L (ref 0–44)
AST: 18 U/L (ref 15–41)
Albumin: 4.3 g/dL (ref 3.5–5.0)
Alkaline Phosphatase: 56 U/L (ref 38–126)
Anion gap: 14 (ref 5–15)
BUN: 9 mg/dL (ref 6–20)
CO2: 18 mmol/L — ABNORMAL LOW (ref 22–32)
Calcium: 9.8 mg/dL (ref 8.9–10.3)
Chloride: 104 mmol/L (ref 98–111)
Creatinine, Ser: 0.55 mg/dL (ref 0.44–1.00)
GFR, Estimated: >60 mL/min (ref >60)
Glucose, Bld: 94 mg/dL (ref 70–99)
Potassium: 3.8 mmol/L (ref 3.5–5.1)
Sodium: 136 mmol/L (ref 135–145)
Total Bilirubin: 0.3 mg/dL (ref 0.0–1.2)
Total Protein: 7.8 g/dL (ref 6.5–8.1)

## 2024-06-06 LAB — PREGNANCY, URINE: Preg Test, Ur: NEGATIVE

## 2024-06-06 LAB — LIPASE, BLOOD: Lipase: 32 U/L (ref 11–51)

## 2024-06-06 MED ORDER — ALUM & MAG HYDROXIDE-SIMETH 200-200-20 MG/5ML PO SUSP
30.0000 mL | Freq: Once | ORAL | Status: AC
  Administered 2024-06-06: 30 mL via ORAL
  Filled 2024-06-06: qty 30

## 2024-06-06 MED ORDER — ONDANSETRON 4 MG PO TBDP
4.0000 mg | ORAL_TABLET | Freq: Once | ORAL | Status: AC
  Administered 2024-06-06: 4 mg via ORAL
  Filled 2024-06-06: qty 1

## 2024-06-06 MED ORDER — LIDOCAINE VISCOUS HCL 2 % MT SOLN
15.0000 mL | Freq: Once | OROMUCOSAL | Status: AC
  Administered 2024-06-06: 15 mL via ORAL
  Filled 2024-06-06: qty 15

## 2024-06-06 NOTE — ED Triage Notes (Signed)
 Pt POV reporting bilateral upper abd/flank pain x2 weeks, radiates to upper back. Denies n/v or dysuria. Hx small fiber neuropathy.

## 2024-06-07 ENCOUNTER — Ambulatory Visit (HOSPITAL_BASED_OUTPATIENT_CLINIC_OR_DEPARTMENT_OTHER)
Admission: RE | Admit: 2024-06-07 | Discharge: 2024-06-07 | Disposition: A | Discharge status: Home / Self Care | Source: Ambulatory Visit | Attending: Emergency Medicine | Admitting: Emergency Medicine

## 2024-06-07 DIAGNOSIS — R1013 Epigastric pain: Secondary | ICD-10-CM | POA: Diagnosis not present

## 2024-06-07 DIAGNOSIS — R1011 Right upper quadrant pain: Secondary | ICD-10-CM | POA: Diagnosis present

## 2024-06-07 LAB — URINALYSIS, ROUTINE W REFLEX MICROSCOPIC
Bilirubin Urine: NEGATIVE
Glucose, UA: NEGATIVE mg/dL
Nitrite: NEGATIVE
Protein, ur: NEGATIVE mg/dL
Specific Gravity, Urine: 1.025 (ref 1.005–1.030)
pH: 6 (ref 5.0–8.0)

## 2024-06-07 MED ORDER — ONDANSETRON 4 MG PO TBDP: 4.0000 mg | ORAL_TABLET | Freq: Three times a day (TID) | ORAL | 0 refills | Status: AC | PRN

## 2024-06-07 MED ORDER — OMEPRAZOLE 20 MG PO CPDR: 20.0000 mg | DELAYED_RELEASE_CAPSULE | Freq: Every day | ORAL | 0 refills | Status: AC

## 2024-06-07 NOTE — Discharge Instructions (Signed)
 You were seen in the emerged from today for evaluation of your symptoms.  I am glad that you are feeling better.  You need to return tomorrow to get an ultrasound of your belly to make sure that your gallbladder is okay.  I have prescribed you a medication called Prilosec which will take daily for the next 2 weeks.  Have also prescribed a medication called Zofran  which can take as needed for nausea.  Try to avoid acidic food such as tomatoes, lemons, citrus, etc. as this may worsen some of your pain.  Additionally, given information for a GI doctor for you to follow-up with.  I would try bland foods to make sure that you are staying well-hydrated.  I have included more for patient on this into the discharge paperwork for you to review.  If you have any concerns, new or worsening symptoms, please return to your nearest emergency department for evaluation.  Contact a doctor if: Your belly pain changes or gets worse. You have very bad cramping or bloating in your belly. You vomit. Your pain gets worse with meals, after eating, or with certain foods. You have trouble pooping or have watery poop for more than 2-3 days. You are not hungry, or you lose weight without trying. You have signs of not getting enough fluid or water (dehydration). These may include: Dark pee, very little pee, or no pee. Cracked lips or dry mouth. Feeling sleepy or weak. You have pain when you pee or poop. Your belly pain wakes you up at night. You have blood in your pee. You have a fever. Get help right away if: You cannot stop vomiting. Your pain is only in one part of your belly, like on the right side. You have bloody or black poop, or poop that looks like tar. You have trouble breathing. You have chest pain. These symptoms may be an emergency. Get help right away. Call 911. Do not wait to see if the symptoms will go away. Do not drive yourself to the hospital.

## 2024-06-07 NOTE — ED Provider Notes (Signed)
 [place] EMERGENCY DEPARTMENT AT [facility] Provider Note   CSN: [id number] Arrival date & time: 06/06/24  2132     Patient presents with: Abdominal Pain   Anita CIANCI is a 23 y.o. female with history of potential autoimmune disorder presents to the emergency department today for evaluation of upper abdominal pain radiating to her bilateral upper flanks for the past month but worsening today.  Reports that she had waffles for breakfast and started develop some pain but had [place] for lunch.  She reports some nausea but no vomiting.  Denies any diarrhea or constipation.  She reports that she has 1-2 bowel movements a day which is normal for her.  No melena or hematochezia.  She denies any dysuria, hematuria, urinary urgency, urinary frequency, chest pain, or shortness of breath.  Denies any recent fevers.  She reports that she has been under increased recent stress lately with family.  No medications trialed for symptoms.  She is allergic amoxicillin.  Denies any tobacco, EtOH illicit drug use.   Abdominal Pain Associated symptoms: nausea   Associated symptoms: no chest pain, no chills, no constipation, no diarrhea, no dysuria, no fever, no hematuria, no shortness of breath, no vaginal bleeding, no vaginal discharge and no vomiting        Prior to Admission medications   Medication Sig Start Date End Date Taking? Authorizing Provider  omeprazole  (PRILOSEC) 20 MG capsule Take 1 capsule (20 mg total) by mouth daily. 06/07/24  Yes Bernis Ernst, PA-C  ondansetron  (ZOFRAN -ODT) 4 MG disintegrating tablet Take 1 tablet (4 mg total) by mouth every 8 (eight) hours as needed for nausea or vomiting. 06/07/24  Yes Bernis Ernst, PA-C  cyanocobalamin  (VITAMIN B12) 1000 MCG/ML injection Inject 1 mL (1,000 mcg total) into the muscle every 30 (thirty) days. 05/12/24   Ines Onetha NOVAK, MD  gabapentin  (NEURONTIN ) 300 MG capsule Take 1 capsule (300 mg total) by mouth 3 (three) times daily as  needed. 04/17/24   Ines Onetha NOVAK, MD  Norethindrone Acetate-Ethinyl Estrad-FE (JUNEL  FE 24) 1-20 MG-MCG(24) tablet Take 1 tablet by mouth daily. 03/20/24   Chrzanowski, Jami B, NP  SYRINGE-NEEDLE, DISP, 3 ML (BD SAFETYGLIDE SYRINGE/NEEDLE) 25G X 1 3 ML MISC Attach needle to syringe and use to draw up and administer B12. Do not reuse. 04/20/24   Ines Onetha NOVAK, MD    Allergies: Amoxicillin    Review of Systems  Constitutional:  Negative for chills and fever.  Respiratory:  Negative for shortness of breath.   Cardiovascular:  Negative for chest pain.  Gastrointestinal:  Positive for abdominal pain and nausea. Negative for blood in stool, constipation, diarrhea and vomiting.  Genitourinary:  Positive for flank pain. Negative for dysuria, hematuria, urgency, vaginal bleeding and vaginal discharge.    Updated Vital Signs BP 114/70   Pulse 95   Temp 99.5 F (37.5 C) (Oral)   Resp 18   Ht 5' 2 (1.575 m)   Wt 57.6 kg   LMP 05/21/2024 (Exact Date)   SpO2 97%   BMI 23.23 kg/m   Physical Exam Vitals and nursing note reviewed.  Constitutional:      General: She is not in acute distress.    Appearance: She is not ill-appearing or toxic-appearing.  Eyes:     General: No scleral icterus. Cardiovascular:     Rate and Rhythm: Normal rate.  Pulmonary:     Effort: Pulmonary effort is normal. No respiratory distress.     Breath sounds: Normal breath  sounds.  Abdominal:     General: There is no distension.     Palpations: Abdomen is soft.     Tenderness: There is abdominal tenderness in the right upper quadrant and epigastric area. There is no right CVA tenderness, left CVA tenderness, guarding or rebound.     Comments: Some mild epigastric and right upper quadrant tenderness palpation.  Soft.  No guarding or rebound.  No other abdominal tenderness.  Nondistended.  Skin:    General: Skin is warm and dry.  Neurological:     Mental Status: She is alert.     (all labs ordered are  listed, but only abnormal results are displayed) Labs Reviewed  COMPREHENSIVE METABOLIC PANEL WITH GFR - Abnormal; Notable for the following components:      Result Value   CO2 18 (*)    All other components within normal limits  CBC - Abnormal; Notable for the following components:   RBC 5.25 (*)    Hemoglobin 10.4 (*)    HCT 33.2 (*)    MCV 63.2 (*)    MCH 19.8 (*)    All other components within normal limits  URINALYSIS, ROUTINE W REFLEX MICROSCOPIC - Abnormal; Notable for the following components:   Hgb urine dipstick SMALL (*)    Ketones, ur TRACE (*)    Leukocytes,Ua SMALL (*)    Bacteria, UA RARE (*)    All other components within normal limits  URINE CULTURE  LIPASE, BLOOD  PREGNANCY, URINE    EKG: EKG Interpretation Date/Time:  Saturday June 06 2024 21:51:27 EDT Ventricular Rate:  103 PR Interval:  144 QRS Duration:  78 QT Interval:  344 QTC Calculation: 450 R Axis:   85  Text Interpretation: Sinus tachycardia Right atrial enlargement Borderline ECG When compared with ECG of 25-Oct-2023 04:07, PREVIOUS ECG IS PRESENT Confirmed by Yolande Charleston [phone]) on 06/06/2024 11:06:46 PM  Radiology: No results found.   Procedures   Medications Ordered in the ED  ondansetron  (ZOFRAN -ODT) disintegrating tablet 4 mg (4 mg Oral Given 06/06/24 2357)  alum & mag hydroxide-simeth (MAALOX/MYLANTA) 200-200-20 MG/5ML suspension 30 mL (30 mLs Oral Given 06/06/24 2357)    And  lidocaine  (XYLOCAINE ) 2 % viscous mouth solution 15 mL (15 mLs Oral Given 06/06/24 2357)    Medical Decision Making Amount and/or Complexity of Data Reviewed Labs: ordered. Radiology: ordered.  Risk OTC drugs. Prescription drug management.   23 y.o. female presents to the ER for evaluation of epigastric pain. Differential diagnosis includes but is not limited to PUD, gastritis, pancreatitis, gastroparesis, malignancy, biliary disease, ACS, pericarditis, pneumonia, intestinal ischemia, esophageal  rupture, hepatitis. Vital signs unremarkable. Physical exam as noted above.   I independently reviewed and interpreted the patient's labs. CBC shows mild anemia hemoglobin 10.4 is really patient's baseline.  No leukocytosis.  CMP shows a bicarb of 18 otherwise no other electrolyte or LFT abnormality.  Lipase within normal limits.  Urinalysis small and hemoglobin present with trace ketones.  There are small leukocytes with 16 white blood cells and rare bacteria.  Pregnancy test is negative.  Patient is not having any urinary symptoms.  Reports that she has had several UTIs before this does not feel similar to that.  Will culture.  Unfortunately we did not have ultrasound available at this time.  I have written a prescription for the patient to return tomorrow to receive a rubber quadrant ultrasound.  She has no other abdominal tender palpation.  Do not think CT images needed at this  time.  She appears good while lying in stretcher in bed.  Reports that her symptoms have improved just with being in the emergency department.  I have ordered her GI cocktail, Prilosec, and Zofran .  Will send her home with an omeprazole  prescription as well as Zofran  ODT's.  I have also referred her to GI specialist.  Patient and family member at bedside are aware that she needs to return tomorrow for out of a quadrant ultrasound.  She is not having any chest pain or shortness breath.  Her pain is reproducible by palpation.  Very low suspicion for any cardiac etiology given that she is not having any other symptoms.  She is hemodynamically stable.  We discussed diet changes and need for follow-up.  We discussed the results of the labs/imaging. The plan is to return tomorrow for ultrasound, take medications prescribed, follow-up with GI/PCP. We discussed strict return precautions and red flag symptoms. The patient verbalized their understanding and agrees to the plan. The patient is stable and being discharged home in good  condition.  Portions of this report may have been transcribed using voice recognition software. Every effort was made to ensure accuracy; however, inadvertent computerized transcription errors may be present.    Final diagnoses:  Epigastric pain    ED Discharge Orders          Ordered    ondansetron  (ZOFRAN -ODT) 4 MG disintegrating tablet  Every 8 hours PRN        06/07/24 0034    omeprazole  (PRILOSEC) 20 MG capsule  Daily        06/07/24 0034    US  Abdomen Limited RUQ/Gall Bladder        06/07/24 0034               Bernis Ernst, PA-C 06/07/24 0052    Yolande Lamar BROCKS, MD 06/11/24 1825

## 2024-06-07 NOTE — ED Notes (Signed)
 Reviewed discharge instructions, medications, and home care with pt. Pt verbalized understanding and had no further questions. Pt exited ED without complications.

## 2024-06-07 NOTE — ED Notes (Signed)
 ED Provider at bedside.

## 2024-06-08 LAB — URINE CULTURE

## 2024-06-09 NOTE — Telephone Encounter (Signed)
 Received notes from Dr Octavia visit 06/05/24.

## 2024-06-10 ENCOUNTER — Emergency Department (HOSPITAL_BASED_OUTPATIENT_CLINIC_OR_DEPARTMENT_OTHER)
Admission: EM | Admit: 2024-06-10 | Discharge: 2024-06-11 | Disposition: A | Discharge status: Home / Self Care | Attending: Emergency Medicine | Admitting: Emergency Medicine

## 2024-06-10 ENCOUNTER — Encounter (HOSPITAL_BASED_OUTPATIENT_CLINIC_OR_DEPARTMENT_OTHER): Payer: Self-pay

## 2024-06-10 ENCOUNTER — Other Ambulatory Visit: Payer: Self-pay

## 2024-06-10 DIAGNOSIS — K625 Hemorrhage of anus and rectum: Secondary | ICD-10-CM | POA: Diagnosis not present

## 2024-06-10 DIAGNOSIS — N939 Abnormal uterine and vaginal bleeding, unspecified: Secondary | ICD-10-CM | POA: Insufficient documentation

## 2024-06-10 DIAGNOSIS — R319 Hematuria, unspecified: Secondary | ICD-10-CM | POA: Insufficient documentation

## 2024-06-10 DIAGNOSIS — R1012 Left upper quadrant pain: Secondary | ICD-10-CM | POA: Diagnosis not present

## 2024-06-10 DIAGNOSIS — R300 Dysuria: Secondary | ICD-10-CM | POA: Insufficient documentation

## 2024-06-10 LAB — CBC
HCT: 31.8 % — ABNORMAL LOW (ref 36.0–46.0)
Hemoglobin: 10 g/dL — ABNORMAL LOW (ref 12.0–15.0)
MCH: 19.9 pg — ABNORMAL LOW (ref 26.0–34.0)
MCHC: 31.4 g/dL (ref 30.0–36.0)
MCV: 63.2 fL — ABNORMAL LOW (ref 80.0–100.0)
Platelets: 330 K/uL (ref 150–400)
RBC: 5.03 MIL/uL (ref 3.87–5.11)
RDW: 14.6 % (ref 11.5–15.5)
WBC: 11 K/uL — ABNORMAL HIGH (ref 4.0–10.5)
nRBC: 0 % (ref 0.0–0.2)

## 2024-06-10 LAB — URINALYSIS, ROUTINE W REFLEX MICROSCOPIC
Bilirubin Urine: NEGATIVE
Glucose, UA: NEGATIVE mg/dL
Leukocytes,Ua: NEGATIVE
Nitrite: NEGATIVE
Protein, ur: 30 mg/dL — AB
Specific Gravity, Urine: 1.014 (ref 1.005–1.030)
pH: 6 (ref 5.0–8.0)

## 2024-06-10 LAB — COMPREHENSIVE METABOLIC PANEL WITH GFR
ALT: 14 U/L (ref 0–44)
AST: 19 U/L (ref 15–41)
Albumin: 4.4 g/dL (ref 3.5–5.0)
Alkaline Phosphatase: 56 U/L (ref 38–126)
Anion gap: 13 (ref 5–15)
BUN: 12 mg/dL (ref 6–20)
CO2: 21 mmol/L — ABNORMAL LOW (ref 22–32)
Calcium: 9.6 mg/dL (ref 8.9–10.3)
Chloride: 105 mmol/L (ref 98–111)
Creatinine, Ser: 0.73 mg/dL (ref 0.44–1.00)
GFR, Estimated: >60 mL/min (ref >60)
Glucose, Bld: 100 mg/dL — ABNORMAL HIGH (ref 70–99)
Potassium: 3.7 mmol/L (ref 3.5–5.1)
Sodium: 139 mmol/L (ref 135–145)
Total Bilirubin: 0.4 mg/dL (ref 0.0–1.2)
Total Protein: 7.6 g/dL (ref 6.5–8.1)

## 2024-06-10 LAB — LIPASE, BLOOD: Lipase: 29 U/L (ref 11–51)

## 2024-06-10 LAB — HCG, SERUM, QUALITATIVE: Preg, Serum: NEGATIVE

## 2024-06-10 NOTE — ED Provider Notes (Incomplete)
 Anita [name]   CSN: [id number] Arrival date & time: 06/10/24  8087     Patient presents with: Abdominal Pain   Anita [name] is a 23 y.o. female.  Abdominal Pain Associated symptoms: hematuria and vaginal bleeding   Patient is a 23 year old female presenting ED today for concerns for hematuria, hematochezia, vaginal bleeding, uncertain as to where she is bleeding but noticing blood in her underwear starting today.  Notes that her last menstrual period was on August 24 and that this would be early for her however notes that when she had a bowel movement she had blood in the toilet bright red.  Has some mild left upper quadrant aminal pain but otherwise does not have any other pain at this time.  Tolerating p.o., without nausea or vomiting.  Denies drug use, alcohol use, chronic NSAID use.  Denies fevers, headaches, vision changes, chest pain, shortness of breath, vaginal discharge, lower leg swelling.     Prior to Admission medications   Medication Sig Start Date End Date Taking? Authorizing Provider  cyanocobalamin  (VITAMIN B12) 1000 MCG/ML injection Inject 1 mL (1,000 mcg total) into the muscle every 30 (thirty) days. 05/12/24   Ines Onetha NOVAK, MD  gabapentin  (NEURONTIN ) 300 MG capsule Take 1 capsule (300 mg total) by mouth 3 (three) times daily as needed. 04/17/24   Ines Onetha NOVAK, MD  Norethindrone Acetate-Ethinyl Estrad-FE (JUNEL  FE 24) 1-20 MG-MCG(24) tablet Take 1 tablet by mouth daily. 03/20/24   Chrzanowski, Jami B, NP  omeprazole  (PRILOSEC) 20 MG capsule Take 1 capsule (20 mg total) by mouth daily. 06/07/24   Bernis Ernst, PA-C  ondansetron  (ZOFRAN -ODT) 4 MG disintegrating tablet Take 1 tablet (4 mg total) by mouth every 8 (eight) hours as needed for nausea or vomiting. 06/07/24   Bernis Ernst, PA-C  SYRINGE-NEEDLE, DISP, 3 ML (BD SAFETYGLIDE SYRINGE/NEEDLE) 25G X 1 3 ML MISC Attach needle to syringe and use to draw  up and administer B12. Do not reuse. 04/20/24   Ines Onetha NOVAK, MD    Allergies: Amoxicillin    Review of Systems  Gastrointestinal:  Positive for abdominal pain and blood in stool.  Genitourinary:  Positive for hematuria and vaginal bleeding.  All other systems reviewed and are negative.   Updated Vital Signs BP 124/75   Pulse (!) 106   Temp 98.2 F (36.8 C)   Resp 20   Ht 5' 2 (1.575 m)   Wt 57.6 kg   LMP 05/21/2024 (Exact Date)   SpO2 92%   BMI 23.23 kg/m   Physical Exam Vitals and nursing [name] reviewed. Exam conducted with a chaperone present.  Constitutional:      General: She is not in acute distress.    Appearance: Normal appearance. She is not ill-appearing or diaphoretic.  HENT:     Head: Normocephalic and atraumatic.  Eyes:     General: No scleral icterus.       Right eye: No discharge.        Left eye: No discharge.     Extraocular Movements: Extraocular movements intact.     Conjunctiva/sclera: Conjunctivae normal.  Cardiovascular:     Rate and Rhythm: Normal rate and regular rhythm.     Pulses: Normal pulses.     Heart sounds: Normal heart sounds. No murmur heard.    No friction rub. No gallop.  Pulmonary:     Effort: Pulmonary effort is normal. No respiratory distress.     Breath  sounds: No stridor. No wheezing, rhonchi or rales.  Chest:     Chest wall: No tenderness.  Abdominal:     General: Abdomen is flat. There is no distension.     Palpations: Abdomen is soft.     Tenderness: There is no abdominal tenderness. There is no right CVA tenderness, left CVA tenderness, guarding or rebound.  Genitourinary:    General: Normal vulva.     Exam position: Lithotomy position.     Tanner stage (genital): 5.     Labia:        Right: No rash, tenderness, lesion or injury.        Left: No rash, tenderness, lesion or injury.      Vagina: Normal. No signs of injury. No erythema, tenderness or bleeding.     Cervix: Normal. No cervical motion tenderness.      Uterus: Normal. Not tender.      Adnexa:        Right: No tenderness.         Left: No tenderness.       Rectum: Normal. No tenderness.     Comments: Normal vaginal discharge noted in vaginal vault. Musculoskeletal:        General: No swelling, deformity or signs of injury.     Cervical back: Normal range of motion. No rigidity.     Right lower leg: No edema.     Left lower leg: No edema.  Skin:    General: Skin is warm and dry.     Findings: No bruising, erythema or lesion.  Neurological:     General: No focal deficit present.     Mental Status: She is alert and oriented to person, place, and time. Mental status is at baseline.     Sensory: No sensory deficit.     Motor: No weakness.  Psychiatric:        Mood and Affect: Mood normal.     (all labs ordered are listed, but only abnormal results are displayed) Labs Reviewed  COMPREHENSIVE METABOLIC PANEL WITH GFR - Abnormal; Notable for the following components:      Result Value   CO2 21 (*)    Glucose, Bld 100 (*)    All other components within normal limits  CBC - Abnormal; Notable for the following components:   WBC 11.0 (*)    Hemoglobin 10.0 (*)    HCT 31.8 (*)    MCV 63.2 (*)    MCH 19.9 (*)    All other components within normal limits  URINALYSIS, ROUTINE W REFLEX MICROSCOPIC - Abnormal; Notable for the following components:   Hgb urine dipstick SMALL (*)    Ketones, ur TRACE (*)    Protein, ur 30 (*)    Bacteria, UA RARE (*)    All other components within normal limits  LIPASE, BLOOD  HCG, SERUM, QUALITATIVE    EKG: None  Radiology: No results found.   Procedures   Medications Ordered in the ED - No data to display   Medical Decision Making Amount and/or Complexity of Data Reviewed Labs: ordered.   This patient is a ***  who presents to the ED for concern of ***.   Differential diagnoses prior to evaluation: The emergent differential diagnosis includes, but is not limited to,  *** . This is  not an exhaustive differential.   Past Medical History / Co-morbidities / Social History: Anxiety, anemia  Additional history: Chart reviewed. Pertinent results include:   Last seen in  the ED on 06/06/2024 undergoing right upper quadrant ultrasound and placed on omeprazole  ambulates around.  Noted to have normal right upper quadrant abdominal ultrasound.  Seen by PCP on 06/08/2024 for flank pain, had urine culture and Toradol done.  No hematuria with KUB done which in 2 avoid CT exposure, noting that if symptoms persist will likely need CT scan again despite her having 2 scans in the last 9 months.  Lab Tests/Imaging studies: I personally interpreted labs/imaging and the pertinent results include:  ***.   ***I agree with the radiologist interpretation.  Cardiac monitoring: EKG obtained and interpreted by myself and attending physician which shows: ***   Medications: I ordered medication including ***.  I have reviewed the patients home medicines and have made adjustments as needed.  Critical Interventions:  Social Determinants of Health:  Disposition: After consideration of the diagnostic results and the patients response to treatment, I feel that the patient would benefit from ***.   ***emergency department workup does not suggest an emergent condition requiring admission or immediate intervention beyond what has been performed at this time. The plan is: ***. The patient is safe for discharge and has been instructed to return immediately for worsening symptoms, change in symptoms or any other concerns.   {Document critical care time when appropriate  Document review of labs and clinical decision tools ie [id number], etc  Document your independent review of radiology images and any outside records  Document your discussion with family members, caretakers and with consultants  Document social determinants of health affecting pt's care  Document your decision making why or why not  admission, treatments were needed:[id number]:::1}   Final diagnoses:  None    ED Discharge Orders     None

## 2024-06-10 NOTE — ED Provider Notes (Signed)
 [place] EMERGENCY DEPARTMENT AT [facility] Provider Note   CSN: [id number] Arrival date & time: 06/10/24  8087     Patient presents with: Abdominal Pain   Anita [name] is a 23 y.o. female.  Abdominal Pain Associated symptoms: hematuria and vaginal bleeding   Patient is a 23 year old female presenting ED today for concerns for hematuria, hematochezia, vaginal bleeding, uncertain as to where she is bleeding but noticing blood in her underwear starting today.  Notes that her last menstrual period was on August 24 and that this would be early for her however notes that when she had a bowel movement she had blood in the toilet bright red.  Has some mild left upper quadrant aminal pain but otherwise does not have any other pain at this time.  Tolerating p.o., without nausea or vomiting.  Denies drug use, alcohol use, chronic NSAID use.  Denies fevers, headaches, vision changes, chest pain, shortness of breath, vaginal discharge, lower leg swelling.     Prior to Admission medications   Medication Sig Start Date End Date Taking? Authorizing Provider  cyanocobalamin  (VITAMIN B12) 1000 MCG/ML injection Inject 1 mL (1,000 mcg total) into the muscle every 30 (thirty) days. 05/12/24   Ines Onetha NOVAK, MD  gabapentin  (NEURONTIN ) 300 MG capsule Take 1 capsule (300 mg total) by mouth 3 (three) times daily as needed. 04/17/24   Ines Onetha NOVAK, MD  Norethindrone Acetate-Ethinyl Estrad-FE (JUNEL  FE 24) 1-20 MG-MCG(24) tablet Take 1 tablet by mouth daily. 03/20/24   Chrzanowski, Jami B, NP  omeprazole  (PRILOSEC) 20 MG capsule Take 1 capsule (20 mg total) by mouth daily. 06/07/24   Bernis Ernst, PA-C  ondansetron  (ZOFRAN -ODT) 4 MG disintegrating tablet Take 1 tablet (4 mg total) by mouth every 8 (eight) hours as needed for nausea or vomiting. 06/07/24   Bernis Ernst, PA-C  SYRINGE-NEEDLE, DISP, 3 ML (BD SAFETYGLIDE SYRINGE/NEEDLE) 25G X 1 3 ML MISC Attach needle to syringe and use to draw  up and administer B12. Do not reuse. 04/20/24   Ines Onetha NOVAK, MD    Allergies: Amoxicillin    Review of Systems  Gastrointestinal:  Positive for abdominal pain and blood in stool.  Genitourinary:  Positive for hematuria and vaginal bleeding.  All other systems reviewed and are negative.   Updated Vital Signs BP (!) 137/91   Pulse (!) 109   Temp 98.7 F (37.1 C) (Oral)   Resp 18   Ht 5' 2 (1.575 m)   Wt 57.6 kg   LMP 05/21/2024 (Exact Date)   SpO2 100%   BMI 23.23 kg/m   Physical Exam Vitals and nursing note reviewed. Exam conducted with a chaperone present.  Constitutional:      General: She is not in acute distress.    Appearance: Normal appearance. She is not ill-appearing or diaphoretic.  HENT:     Head: Normocephalic and atraumatic.  Eyes:     General: No scleral icterus.       Right eye: No discharge.        Left eye: No discharge.     Extraocular Movements: Extraocular movements intact.     Conjunctiva/sclera: Conjunctivae normal.  Cardiovascular:     Rate and Rhythm: Normal rate and regular rhythm.     Pulses: Normal pulses.     Heart sounds: Normal heart sounds. No murmur heard.    No friction rub. No gallop.  Pulmonary:     Effort: Pulmonary effort is normal. No respiratory distress.  Breath sounds: No stridor. No wheezing, rhonchi or rales.  Chest:     Chest wall: No tenderness.  Abdominal:     General: Abdomen is flat. There is no distension.     Palpations: Abdomen is soft.     Tenderness: There is no abdominal tenderness. There is no right CVA tenderness, left CVA tenderness, guarding or rebound.  Genitourinary:    General: Normal vulva.     Exam position: Lithotomy position.     Tanner stage (genital): 5.     Labia:        Right: No rash, tenderness, lesion or injury.        Left: No rash, tenderness, lesion or injury.      Vagina: Normal. No signs of injury. No erythema, tenderness or bleeding.     Cervix: Normal. No cervical motion  tenderness.     Uterus: Normal. Not tender.      Adnexa:        Right: No tenderness.         Left: No tenderness.       Rectum: Normal. No tenderness.     Comments: Normal vaginal discharge noted in vaginal vault. Musculoskeletal:        General: No swelling, deformity or signs of injury.     Cervical back: Normal range of motion. No rigidity.     Right lower leg: No edema.     Left lower leg: No edema.  Skin:    General: Skin is warm and dry.     Findings: No bruising, erythema or lesion.  Neurological:     General: No focal deficit present.     Mental Status: She is alert and oriented to person, place, and time. Mental status is at baseline.     Sensory: No sensory deficit.     Motor: No weakness.  Psychiatric:        Mood and Affect: Mood normal.     (all labs ordered are listed, but only abnormal results are displayed) Labs Reviewed  COMPREHENSIVE METABOLIC PANEL WITH GFR - Abnormal; Notable for the following components:      Result Value   CO2 21 (*)    Glucose, Bld 100 (*)    All other components within normal limits  CBC - Abnormal; Notable for the following components:   WBC 11.0 (*)    Hemoglobin 10.0 (*)    HCT 31.8 (*)    MCV 63.2 (*)    MCH 19.9 (*)    All other components within normal limits  URINALYSIS, ROUTINE W REFLEX MICROSCOPIC - Abnormal; Notable for the following components:   Hgb urine dipstick SMALL (*)    Ketones, ur TRACE (*)    Protein, ur 30 (*)    Bacteria, UA RARE (*)    All other components within normal limits  WET PREP, GENITAL  URINE CULTURE  LIPASE, BLOOD  HCG, SERUM, QUALITATIVE  OCCULT BLOOD X 1 CARD TO LAB, STOOL  GC/CHLAMYDIA PROBE AMP ([place]) NOT AT [facility]    EKG: None  Radiology: No results found.   Procedures   Medications Ordered in the ED - No data to display   Medical Decision Making Amount and/or Complexity of Data Reviewed Labs: ordered.   This patient is a 23 year old female who presents to the  ED for concern of possible vaginal versus urinary versus rectal bleeding starting today, seen by PCP recently and told she may have stone but due to her having multiple recent  CT scans, in the last 9 months.  On physical exam, patient is in no acute distress, afebrile, alert and orient x 4, speaking in full sentences, nontachypneic, nontachycardic with heart rate of low 100s.  Noted to have some mild left upper quad abdominal pain to palpation but otherwise no lower abdominal tenderness.  GU exam was unremarkable with no signs of bleeding in vaginal vault or rectum.  LCTAB.  With current presentation, lab work was done and did not show any acute findings as cause of patient's symptoms.  Near baseline when she was seen previously.  Waiting on reevaluation pending GU swabs and patient care transferred over to Dr. Polina.  Differential diagnoses prior to evaluation: The emergent differential diagnosis includes, but is not limited to, UTI, STI, lower GI bleed, hemorrhoids, anal fissure, PID, pyelonephritis, nephrolithiasis, diverticulitis. This is not an exhaustive differential.   Past Medical History / Co-morbidities / Social History: Anxiety, anemia  Additional history: Chart reviewed. Pertinent results include:   Last seen in the ED on 06/06/2024 undergoing right upper quadrant ultrasound and placed on omeprazole  ambulates around.  Noted to have normal right upper quadrant abdominal ultrasound.  Seen by PCP on 06/08/2024 for flank pain, had urine culture and Toradol done.  No hematuria with KUB done which in 2 avoid CT exposure, noting that if symptoms persist will likely need CT scan again despite her having 2 scans in the last 9 months.  Lab Tests/Imaging studies: I personally interpreted labs/imaging and the pertinent results include:    CBC notes a mildly elevated white count 11.0, anemia of 10.0 hemoglobin.  Baseline  CMP unremarkable hCG qualitative negative UA notes small hemoglobin  ketones with rare bacteria with urine culture pending Wet prep unremarkable Fecal occult negative   Medications:  I have reviewed the patients home medicines and have made adjustments as needed.  Critical Interventions: None  Social Determinants of Health: None  Disposition: 12:16 AM Care of Anita [name] transferred to   Dr. Haze at the end of my shift as the patient will require reassessment once labs/imaging have resulted. Patient presentation, ED course, and plan of care discussed with review of all pertinent labs and imaging. Please see his/her note for further details regarding further ED course and disposition. Plan at time of handoff is assessment needing CT versus follow-up PCP. This may be altered or completely changed at the discretion of the oncoming team pending results of further workup.    Final diagnoses:  Dysuria    ED Discharge Orders     None          Beola Terrall GORMAN DEVONNA 06/11/24 0018    Randol Simmonds, MD 06/12/24 [phone]

## 2024-06-10 NOTE — ED Triage Notes (Signed)
 Left sided abdominal pain and rib cage pain for several days.   Says she recently had evaluation here and was inconclusive.   Now having hematuria, vaginal, and rectal bleeding.   PCP encouraged to ED with ongoing pain.

## 2024-06-11 LAB — WET PREP, GENITAL
Clue Cells Wet Prep HPF POC: NONE SEEN
Sperm: NONE SEEN
Trich, Wet Prep: NONE SEEN
WBC, Wet Prep HPF POC: <10 (ref <10)
Yeast Wet Prep HPF POC: NONE SEEN

## 2024-06-11 LAB — OCCULT BLOOD X 1 CARD TO LAB, STOOL: Fecal Occult Bld: NEGATIVE

## 2024-06-11 NOTE — Discharge Instructions (Signed)
 Continue your antibiotics.  Follow-up with your doctor in the office as needed.

## 2024-06-12 LAB — GC/CHLAMYDIA PROBE AMP (~~LOC~~) NOT AT ARMC
Chlamydia: NEGATIVE
Comment: NEGATIVE
Comment: NORMAL
Neisseria Gonorrhea: NEGATIVE

## 2024-06-12 LAB — URINE CULTURE: Culture: <10000 — AB

## 2024-07-08 ENCOUNTER — Telehealth: Payer: Self-pay | Admitting: Neurology

## 2024-07-08 NOTE — Telephone Encounter (Signed)
 Called and left voicemail for patient to reschedule appointment on 10/13/24 with Dr Ines.  If patient calls back, they can be rescheduled with Dr. Onita

## 2024-08-23 ENCOUNTER — Encounter (HOSPITAL_COMMUNITY): Payer: Self-pay | Admitting: Ophthalmology

## 2024-08-23 NOTE — H&P (Signed)
 Anita [name] is an 23 y.o. female.   Chief Complaint:  Constant unremitting double vision. HPI: 83 y,o. WF c hx of chronic mostly vertical diplopia  no longer controlled c head tilting and squinting c sequelae of headache . Pt presents for strabismus surgery to realign eyes for single binocular vision .  Past Medical History:  Diagnosis Date   Anemia    Anxiety    B12 deficiency     Past Surgical History:  Procedure Laterality Date   NO PAST SURGERIES      Family History  Problem Relation Age of Onset   Anemia Mother    Thyroid  disease Maternal Grandmother    Anemia Maternal Grandmother    Social History:  reports that she has quit smoking. Her smoking use included e-cigarettes. She has never used smokeless tobacco. She reports current alcohol use. She reports that she does not use drugs.  Allergies:  Allergies  Allergen Reactions   Amoxicillin Other (See Comments)    Patient can take pill form but liquid makes her tongue swell and gives her blisters.      No medications prior to admission.    No results found for this or any previous visit (from the past 48 hours). No results found.  Review of Systems  Constitutional: Negative.   HENT: Negative.    Eyes:        Diplopia  : RHT :  XT   Respiratory: Negative.    Cardiovascular: Negative.   Gastrointestinal: Negative.   Endocrine: Negative.   Genitourinary: Negative.   Musculoskeletal: Negative.   Skin: Negative.   Neurological: Negative.   Hematological:        Pernicious  anemia  Psychiatric/Behavioral: Negative.      There were no vitals taken for this visit. Physical Exam Constitutional:      Appearance: Normal appearance. She is normal weight.  HENT:     Head: Normocephalic and atraumatic.     Nose: Nose normal.  Eyes:     Pupils: Pupils are equal, round, and reactive to light.      Comments: RHT :  LXT.  Cardiovascular:     Rate and Rhythm: Normal rate.  Pulmonary:     Effort: Pulmonary  effort is normal.  Abdominal:     General: Abdomen is flat.  Musculoskeletal:     Cervical back: Normal range of motion.  Skin:    General: Skin is warm.  Neurological:     General: No focal deficit present.     Mental Status: She is alert.  Psychiatric:        Behavior: Behavior normal.      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  Plan:  RSO tuck  RIO recess : LIR recess on AS :  LLR recess on AS. Anita Mirza, MD 08/23/2024, 4:36 PM

## 2024-08-24 ENCOUNTER — Encounter (HOSPITAL_COMMUNITY): Payer: Self-pay | Admitting: Ophthalmology

## 2024-08-24 ENCOUNTER — Other Ambulatory Visit: Payer: Self-pay

## 2024-08-24 NOTE — Progress Notes (Signed)
 SDW call  Patient was given pre-op instructions over the phone. Patient verbalized understanding of instructions provided. She is having an active flare up of Lupus, unable to get into her Rheumatologist. Patient called back, Dr. Jacques is ok to do surgery, will check with Anesthesia. Note sent to [facility].  She is having abd pain and swelling, but better than she was 2 days ago. Denies joint or bone pain, mild SOB but does have anxiety. Per Lynwood, symptoms should not hold up surgery.     PCP - Kip Sheffield, PA-C Cardiologist - Dr. Waymond, no need to return unless problems Pulmonary:    PPM/ICD - denies Device Orders - na Rep Notified - na   Chest x-ray - 10/25/2023 EKG -  06/10/2024 Stress Test - ECHO - 07/12/2023 Cardiac Cath -   Sleep Study/sleep apnea/CPAP: denies  Non-diabetic  Blood Thinner Instructions: denies Aspirin  Instructions:denies   ERAS Protcol - Clears until 0705   Anesthesia review: Yes. Mz:oleld issue noted above  Your procedure is scheduled on Wednesday August 26, 2024  Report to [facility] Main Entrance A at  [phone] A.M., then check in with the Admitting office.  Call this number if you have problems the morning of surgery:  [phone]   If you have any questions prior to your surgery date call [phone]: Open Monday-Friday 8am-4pm If you experience any cold or flu symptoms such as cough, fever, chills, shortness of breath, etc. between now and your scheduled surgery, please notify us  at the above number     Remember:  Do not eat after midnight the night before your surgery  You may drink clear liquids until  0705   the morning of your surgery.   Clear liquids allowed are: Water, Non-Citrus Juices (without pulp), Carbonated Beverages, Clear Tea, Black Coffee ONLY (NO MILK, CREAM OR POWDERED CREAMER of any kind), and Gatorade   Take these medicines the morning of surgery with A SIP OF WATER:  Norethindrone  As of today, STOP taking  any Aspirin  (unless otherwise instructed by your surgeon) Aleve , Naproxen , Ibuprofen, Motrin, Advil, Goody's, BC's, all herbal medications, fish oil, and all vitamins.

## 2024-08-24 NOTE — Anesthesia Preprocedure Evaluation (Signed)
 Anesthesia Evaluation  Patient identified by MRN, date of birth, ID band Patient awake    Reviewed: Allergy & Precautions, NPO status , Patient's Chart, lab work & pertinent test results  Airway Mallampati: I  TM Distance: >3 FB Neck ROM: Full    Dental no notable dental hx. (+) Dental Advisory Given, Teeth Intact   Pulmonary neg recent URI, former smoker   Pulmonary exam normal breath sounds clear to auscultation       Cardiovascular negative cardio ROS Normal cardiovascular exam Rhythm:Regular Rate:Tachycardia     Neuro/Psych   Anxiety     negative neurological ROS     GI/Hepatic negative GI ROS, Neg liver ROS,neg GERD  ,,  Endo/Other  negative endocrine ROS    Renal/GU negative Renal ROS     Musculoskeletal negative musculoskeletal ROS (+)    Abdominal   Peds  Hematology  (+) Blood dyscrasia, anemia   Anesthesia Other Findings   Reproductive/Obstetrics negative OB ROS                              Anesthesia Physical Anesthesia Plan  ASA: 2  Anesthesia Plan: General   Post-op Pain Management: Tylenol  PO (pre-op)* and Gabapentin  PO (pre-op)*   Induction: Intravenous  PONV Risk Score and Plan: 4 or greater and Ondansetron , Dexamethasone , Propofol  infusion, TIVA, Treatment may vary due to age or medical condition and Midazolam   Airway Management Planned: Oral ETT  Additional Equipment:   Intra-op Plan:   Post-operative Plan: Extubation in OR  Informed Consent: I have reviewed the patients History and Physical, chart, labs and discussed the procedure including the risks, benefits and alternatives for the proposed anesthesia with the patient or authorized representative who has indicated his/her understanding and acceptance.     Dental advisory given  Plan Discussed with: CRNA  Anesthesia Plan Comments: (PAT note by Lynwood Hope, PA-C: 23 year old female with pertinent  history including anxiety, anemia, migraines, B12 deficiency, positive ANA.  Patient has undergone extensive workup for complaints of diffuse pain in hand, neck, back, abdomen, shoulders, numbness and tingling in both upper extremities ongoing for ~2 years.  Cardiology evaluation was benign, normal echo 07/2023 recently diagnosed with B12 deficiency and positive intrinsic factor antibodies in July 2025, started on replacement with injections.  She had rheumatologic labs ordered in August 2025.  ANA was positive.  dsDNA antibody was negative.  Smith/RNP antibodies were negative.  CRP was mildly elevated at 9.4, ESR was normal, CCP was normal.  She subsequently had additional labs in October 2025 with negative lupus anticoagulant screen, negative anticardiolipin antibodies, negative Beta-2 Glycoprotein 1 Antibodies.  She reports she has pending evaluation with rheumatology in February 2026 for what she says is a diagnosis of lupus.  CBC 07/28/2024 reviewed, mild anemia hemoglobin 10.7, otherwise unremarkable, WBC 10.8, platelets 394.  BMP 07/03/2024 reviewed, unremarkable, sodium 136, potassium 3.8, creatinine 0.56.  She will need day of surgery evaluation.  EKG 06/06/2024: Sinus tachycardia.  Rate 103. Right atrial enlargement  CT abdomen pelvis 08/17/2024 (Care Everywhere): IMPRESSION:  CONCLUSION:  1. No acute intra-abdominal or intrapelvic lesion seen.  2. Interval resolution of previously seen renal perfusion deficits   TTE 07/12/2023 (Care Everywhere): Normal LV size, wall thickness, wall motion and systolic function with  ejection fraction 50-55%  Left ventricular filling pattern is normal.  There is mild mitral regurgitation.  There is trace tricuspid regurgitation.  No pulmonary hypertension.  There is no pericardial  effusion.  The IVC is normal in size with an inspiratory collapse of greater then  50%, suggesting normal right  atrial pressure.  The aortic sinus is normal size.   There is no comparison study available.   )         Anesthesia Quick Evaluation

## 2024-08-24 NOTE — Progress Notes (Signed)
 Anesthesia Chart Review: Same day workup  23 year old female with pertinent history including anxiety, anemia, migraines, B12 deficiency, positive ANA.  Patient has undergone extensive workup for complaints of diffuse pain in hand, neck, back, abdomen, shoulders, numbness and tingling in both upper extremities ongoing for ~2 years.  Cardiology evaluation was benign, normal echo 07/2023 recently diagnosed with B12 deficiency and positive intrinsic factor antibodies in July 2025, started on replacement with injections.  She had rheumatologic labs ordered in August 2025.  ANA was positive.  dsDNA antibody was negative.  Smith/RNP antibodies were negative.  CRP was mildly elevated at 9.4, ESR was normal, CCP was normal.  She subsequently had additional labs in October 2025 with negative lupus anticoagulant screen, negative anticardiolipin antibodies, negative Beta-2 Glycoprotein 1 Antibodies.  She reports she has pending evaluation with rheumatology in February 2026 for what she says is a diagnosis of lupus.  CBC 07/28/2024 reviewed, mild anemia hemoglobin 10.7, otherwise unremarkable, WBC 10.8, platelets 394.  BMP 07/03/2024 reviewed, unremarkable, sodium 136, potassium 3.8, creatinine 0.56.  She will need day of surgery evaluation.  EKG 06/06/2024: Sinus tachycardia.  Rate 103. Right atrial enlargement  CT abdomen pelvis 08/17/2024 (Care Everywhere): IMPRESSION:  CONCLUSION:  1. No acute intra-abdominal or intrapelvic lesion seen.  2. Interval resolution of previously seen renal perfusion deficits   TTE 07/12/2023 (Care Everywhere): Normal LV size, wall thickness, wall motion and systolic function with  ejection fraction 50-55%  Left ventricular filling pattern is normal.  There is mild mitral regurgitation.  There is trace tricuspid regurgitation.  No pulmonary hypertension.  There is no pericardial effusion.  The IVC is normal in size with an inspiratory collapse of greater then  50%,  suggesting normal right  atrial pressure.  The aortic sinus is normal size.  There is no comparison study available.     Lynwood Geofm RIGGERS [facility] Short Stay [facility]/Anesthesiology Phone [phone] 08/24/2024 3:41 PM

## 2024-08-24 NOTE — Progress Notes (Addendum)
 SDW, spoke to patient. She is having an active flare up of Lupus, unable to get into her Rheumatologist. She has left a message with Dr. Marcina office to see if he wants to reschedule.   Patient called back, Dr. Jacques is ok to do surgery, will check with Anesthesia. Note sent to [facility].  She is having abd pain and swelling, but better than she was 2 days ago. Denies joint or bone pain, mild SOB but does have anxiety. Per Lynwood, symptoms should not hold up surgery.

## 2024-08-26 ENCOUNTER — Ambulatory Visit (HOSPITAL_COMMUNITY)
Admission: RE | Admit: 2024-08-26 | Discharge: 2024-08-26 | Disposition: A | Discharge status: Home / Self Care | Attending: Ophthalmology | Admitting: Ophthalmology

## 2024-08-26 ENCOUNTER — Ambulatory Visit (HOSPITAL_BASED_OUTPATIENT_CLINIC_OR_DEPARTMENT_OTHER): Payer: Self-pay | Admitting: Physician Assistant

## 2024-08-26 ENCOUNTER — Other Ambulatory Visit: Payer: Self-pay

## 2024-08-26 ENCOUNTER — Ambulatory Visit (HOSPITAL_COMMUNITY): Payer: Self-pay | Admitting: Physician Assistant

## 2024-08-26 ENCOUNTER — Encounter (HOSPITAL_COMMUNITY)
Admission: RE | Disposition: A | Discharge status: Home / Self Care | Payer: Self-pay | Source: Home / Self Care | Attending: Ophthalmology

## 2024-08-26 ENCOUNTER — Encounter (HOSPITAL_COMMUNITY): Payer: Self-pay | Admitting: Ophthalmology

## 2024-08-26 DIAGNOSIS — H4911 Fourth [trochlear] nerve palsy, right eye: Secondary | ICD-10-CM

## 2024-08-26 HISTORY — PX: STRABISMUS SURGERY: SHX218

## 2024-08-26 HISTORY — DX: Systemic lupus erythematosus, unspecified: M32.9

## 2024-08-26 LAB — CBC
HCT: 33.1 % — ABNORMAL LOW (ref 36.0–46.0)
Hemoglobin: 10.3 g/dL — ABNORMAL LOW (ref 12.0–15.0)
MCH: 19.5 pg — ABNORMAL LOW (ref 26.0–34.0)
MCHC: 31.1 g/dL (ref 30.0–36.0)
MCV: 62.8 fL — ABNORMAL LOW (ref 80.0–100.0)
Platelets: 312 K/uL (ref 150–400)
RBC: 5.27 MIL/uL — ABNORMAL HIGH (ref 3.87–5.11)
RDW: 14.8 % (ref 11.5–15.5)
WBC: 8.8 K/uL (ref 4.0–10.5)
nRBC: 0 % (ref 0.0–0.2)

## 2024-08-26 LAB — POCT PREGNANCY, URINE: Preg Test, Ur: NEGATIVE

## 2024-08-26 SURGERY — REPAIR STRABISMUS
Anesthesia: General | Site: Eye | Laterality: Bilateral

## 2024-08-26 MED ORDER — LIDOCAINE-EPINEPHRINE 2 %-1:100000 IJ SOLN
INTRAMUSCULAR | Status: AC
  Filled 2024-08-26: qty 1

## 2024-08-26 MED ORDER — PHENYLEPHRINE HCL 2.5 % OP SOLN
OPHTHALMIC | Status: AC
  Filled 2024-08-26: qty 2

## 2024-08-26 MED ORDER — ACETAMINOPHEN-CODEINE 300-30 MG PO TABS: 1.0000 | ORAL_TABLET | ORAL | 0 refills | Status: AC | PRN

## 2024-08-26 MED ORDER — DEXMEDETOMIDINE HCL IN NACL 200 MCG/50ML IV SOLN
INTRAVENOUS | Status: DC | PRN
  Administered 2024-08-26 (×3): 4 ug via INTRAVENOUS
  Administered 2024-08-26: 8 ug via INTRAVENOUS

## 2024-08-26 MED ORDER — PHENYLEPHRINE HCL 2.5 % OP SOLN
OPHTHALMIC | Status: DC | PRN
Start: 2024-08-26 — End: 2024-08-26
  Administered 2024-08-26: 3 [drp] via OPHTHALMIC

## 2024-08-26 MED ORDER — FENTANYL CITRATE (PF) 250 MCG/5ML IJ SOLN
INTRAMUSCULAR | Status: DC | PRN
  Administered 2024-08-26 (×5): 50 ug via INTRAVENOUS

## 2024-08-26 MED ORDER — FENTANYL CITRATE (PF) 100 MCG/2ML IJ SOLN: 25.0000 ug | INTRAMUSCULAR | Status: DC | PRN

## 2024-08-26 MED ORDER — LIDOCAINE HCL (CARDIAC) PF 100 MG/5ML IV SOSY
PREFILLED_SYRINGE | INTRAVENOUS | Status: DC | PRN
  Administered 2024-08-26: 60 mg via INTRAVENOUS

## 2024-08-26 MED ORDER — PROPOFOL 10 MG/ML IV BOLUS
INTRAVENOUS | Status: AC
Start: 2024-08-26 — End: 2024-08-26
  Filled 2024-08-26: qty 20

## 2024-08-26 MED ORDER — OXYCODONE HCL 5 MG/5ML PO SOLN: 5.0000 mg | Freq: Once | ORAL | Status: DC | PRN

## 2024-08-26 MED ORDER — LACTATED RINGERS IV SOLN: INTRAVENOUS | Status: DC

## 2024-08-26 MED ORDER — GABAPENTIN 300 MG PO CAPS: 300.0000 mg | ORAL_CAPSULE | Freq: Once | ORAL | Status: AC

## 2024-08-26 MED ORDER — TETRACAINE HCL 0.5 % OP SOLN
OPHTHALMIC | Status: AC
  Filled 2024-08-26: qty 4

## 2024-08-26 MED ORDER — SUGAMMADEX SODIUM 200 MG/2ML IV SOLN
INTRAVENOUS | Status: DC | PRN
  Administered 2024-08-26: 200 mg via INTRAVENOUS

## 2024-08-26 MED ORDER — TOBRADEX 0.3-0.1 % OP OINT: 1.0000 | TOPICAL_OINTMENT | Freq: Two times a day (BID) | OPHTHALMIC | Status: AC

## 2024-08-26 MED ORDER — LIDOCAINE 2% (20 MG/ML) 5 ML SYRINGE
INTRAMUSCULAR | Status: AC
  Filled 2024-08-26: qty 5

## 2024-08-26 MED ORDER — BSS IO SOLN
INTRAOCULAR | Status: DC | PRN
  Administered 2024-08-26: 15 mL via INTRAOCULAR

## 2024-08-26 MED ORDER — DEXMEDETOMIDINE HCL IN NACL 80 MCG/20ML IV SOLN
INTRAVENOUS | Status: AC
  Filled 2024-08-26: qty 20

## 2024-08-26 MED ORDER — CHLORHEXIDINE GLUCONATE 0.12 % MT SOLN: 15.0000 mL | Freq: Once | OROMUCOSAL | Status: AC

## 2024-08-26 MED ORDER — ROCURONIUM BROMIDE 10 MG/ML (PF) SYRINGE
PREFILLED_SYRINGE | INTRAVENOUS | Status: AC
  Filled 2024-08-26: qty 10

## 2024-08-26 MED ORDER — GABAPENTIN 300 MG PO CAPS
ORAL_CAPSULE | ORAL | Status: AC
  Administered 2024-08-26: 300 mg via ORAL
  Filled 2024-08-26: qty 1

## 2024-08-26 MED ORDER — PROPOFOL 1000 MG/100ML IV EMUL
INTRAVENOUS | Status: AC
  Filled 2024-08-26: qty 100

## 2024-08-26 MED ORDER — ONDANSETRON HCL 4 MG/2ML IJ SOLN
INTRAMUSCULAR | Status: DC | PRN
  Administered 2024-08-26: 4 mg via INTRAVENOUS

## 2024-08-26 MED ORDER — PROPOFOL 500 MG/50ML IV EMUL
INTRAVENOUS | Status: DC | PRN
  Administered 2024-08-26: 200 ug/kg/min via INTRAVENOUS
  Administered 2024-08-26: 150 ug/kg/min via INTRAVENOUS

## 2024-08-26 MED ORDER — TOBRAMYCIN 0.3 % OP OINT
TOPICAL_OINTMENT | OPHTHALMIC | Status: DC | PRN
  Administered 2024-08-26: 1 via OPHTHALMIC

## 2024-08-26 MED ORDER — BSS IO SOLN
INTRAOCULAR | Status: AC
  Filled 2024-08-26: qty 15

## 2024-08-26 MED ORDER — MIDAZOLAM HCL 2 MG/2ML IJ SOLN
INTRAMUSCULAR | Status: AC
  Filled 2024-08-26: qty 2

## 2024-08-26 MED ORDER — ORAL CARE MOUTH RINSE: 15.0000 mL | Freq: Once | OROMUCOSAL | Status: AC

## 2024-08-26 MED ORDER — PROPOFOL 10 MG/ML IV BOLUS
INTRAVENOUS | Status: DC | PRN
  Administered 2024-08-26: 160 mg via INTRAVENOUS

## 2024-08-26 MED ORDER — OXYCODONE HCL 5 MG PO TABS: 5.0000 mg | ORAL_TABLET | Freq: Once | ORAL | Status: DC | PRN

## 2024-08-26 MED ORDER — PROPOFOL 1000 MG/100ML IV EMUL
INTRAVENOUS | Status: AC
  Filled 2024-08-26: qty 200

## 2024-08-26 MED ORDER — MIDAZOLAM HCL (PF) 2 MG/2ML IJ SOLN
INTRAMUSCULAR | Status: DC | PRN
  Administered 2024-08-26: 2 mg via INTRAVENOUS

## 2024-08-26 MED ORDER — ONDANSETRON HCL 4 MG/2ML IJ SOLN
INTRAMUSCULAR | Status: AC
  Filled 2024-08-26: qty 2

## 2024-08-26 MED ORDER — DEXAMETHASONE SOD PHOSPHATE PF 10 MG/ML IJ SOLN
INTRAMUSCULAR | Status: DC | PRN
  Administered 2024-08-26: 5 mg via INTRAVENOUS

## 2024-08-26 MED ORDER — ACETAMINOPHEN-CODEINE 300-30 MG PO TABS: 1.0000 | ORAL_TABLET | Freq: Four times a day (QID) | ORAL | 0 refills | Status: AC | PRN

## 2024-08-26 MED ORDER — DROPERIDOL 2.5 MG/ML IJ SOLN: 0.6250 mg | Freq: Once | INTRAMUSCULAR | Status: DC | PRN

## 2024-08-26 MED ORDER — CHLORHEXIDINE GLUCONATE 0.12 % MT SOLN
OROMUCOSAL | Status: AC
  Administered 2024-08-26: 15 mL via OROMUCOSAL
  Filled 2024-08-26: qty 15

## 2024-08-26 MED ORDER — ROCURONIUM BROMIDE 10 MG/ML (PF) SYRINGE
PREFILLED_SYRINGE | INTRAVENOUS | Status: DC | PRN
  Administered 2024-08-26: 40 mg via INTRAVENOUS
  Administered 2024-08-26 (×3): 10 mg via INTRAVENOUS

## 2024-08-26 MED ORDER — ACETAMINOPHEN 500 MG PO TABS: 1000.0000 mg | ORAL_TABLET | Freq: Once | ORAL | Status: AC

## 2024-08-26 MED ORDER — FENTANYL CITRATE (PF) 250 MCG/5ML IJ SOLN
INTRAMUSCULAR | Status: AC
  Filled 2024-08-26: qty 5

## 2024-08-26 MED ORDER — ACETAMINOPHEN 500 MG PO TABS
ORAL_TABLET | ORAL | Status: AC
  Administered 2024-08-26: 1000 mg via ORAL
  Filled 2024-08-26: qty 2

## 2024-08-26 MED ORDER — TOBRAMYCIN-DEXAMETHASONE 0.3-0.1 % OP OINT
TOPICAL_OINTMENT | OPHTHALMIC | Status: AC
  Filled 2024-08-26: qty 3.5

## 2024-08-26 SURGICAL SUPPLY — 18 items
APPLICATOR DR MATTHEWS STRL (MISCELLANEOUS) ×1 IMPLANT
CAUTERY EYE LOW TEMP OLD (MISCELLANEOUS) IMPLANT
COVER SURGICAL LIGHT HANDLE (MISCELLANEOUS) ×1 IMPLANT
DRAPE SURG 17X23 STRL (DRAPES) ×3 IMPLANT
GAUZE SPONGE 4X4 12PLY STRL (GAUZE/BANDAGES/DRESSINGS) ×1 IMPLANT
GLOVE BIOGEL PI MICRO STRL 7 (GLOVE) ×1 IMPLANT
GLOVE SURG SIGNA 7.5 PF LTX (GLOVE) ×2 IMPLANT
GOWN STRL REUS W/ TWL LRG LVL3 (GOWN DISPOSABLE) ×2 IMPLANT
KIT BASIN OR (CUSTOM PROCEDURE TRAY) ×1 IMPLANT
KIT TURNOVER KIT B (KITS) ×1 IMPLANT
PACK SRG BSC III STRL LF ECLPS (CUSTOM PROCEDURE TRAY) IMPLANT
PAD ARMBOARD POSITIONER FOAM (MISCELLANEOUS) ×2 IMPLANT
SOLN 0.9% NACL POUR BTL 1000ML (IV SOLUTION) ×1 IMPLANT
SOLN STERILE WATER BTL 1000 ML (IV SOLUTION) ×1 IMPLANT
STRIP CLOSURE SKIN 1/2X4 (GAUZE/BANDAGES/DRESSINGS) ×1 IMPLANT
SUT VICRYL 6 0 S 29 12 (SUTURE) ×1 IMPLANT
TOWEL GREEN STERILE FF (TOWEL DISPOSABLE) ×2 IMPLANT
WIPE INSTRUMENT VISIWIPE 73X73 (MISCELLANEOUS) ×1 IMPLANT

## 2024-08-26 NOTE — Brief Op Note (Signed)
 08/26/2024  1:53 PM  PATIENT:  Anita [name]  23 y.o. female  PRE-OPERATIVE DIAGNOSIS:  RIGHT FORTH NERVE PALSYDIPLOPIA  POST-OPERATIVE DIAGNOSIS:  RIGHT FORTH NERVE PALSYDIPLOPIA  PROCEDURE:  Procedure(s): RIGHT SUPERIOR OBLIQUE TUCK, RIGHT INFERIOR OBLIQUE RECESSION, LEFT INFERIOR RECTUS RECESSION, LEFT LATERAL RECTUS RECESSION, ADJUSTABLE SUTURES AT LEFT INFERIOR RECTUS (Bilateral)  SURGEON:  Surgeons and Role:    DEWAINE Jacques Sharper, MD - Primary  PHYSICIAN ASSISTANT:   ASSISTANTS: none   ANESTHESIA:   general  EBL:  5 mL   BLOOD ADMINISTERED:none  DRAINS: none   LOCAL MEDICATIONS USED:  NONE  SPECIMEN:  No Specimen  DISPOSITION OF SPECIMEN:  N/A  COUNTS:  YES  TOURNIQUET:  * No tourniquets in log *  DICTATION: .Other Dictation: Dictation Number [id number]  PLAN OF CARE: Discharge to home after PACU  PATIENT DISPOSITION:  PACU - hemodynamically stable.   Delay start of Pharmacological VTE agent (>24hrs) due to surgical blood loss or risk of bleeding: yes

## 2024-08-26 NOTE — Op Note (Signed)
 Anita, [name] MEDICAL RECORD NO: [id number] ACCOUNT NO: [id number] DATE OF BIRTH: [birth year] FACILITY: [facility] LOCATION: [facility] PHYSICIAN: Ozell LABOR. 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  POSTOPERATIVE DIAGNOSIS:  Status post strabismus repair, both eyes.  PROCEDURE:  Right superior oblique tuck of 8 mm, a right inferior oblique recession of 10 mm, a left inferior rectus recession of 3 mm on adjustable suture, and a left lateral rectus recession of 3 mm on adjustable suture.  SURGEON:  Ozell LABOR. Jacques, MD.  ANESTHESIA:  General with laryngeal mask airway.  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  This procedure is indicated to restore alignment  of the visual axis and to resolve the diplopia and restore single binocular vision in all positions of gaze.  The risks and benefits of the procedure were explained to the patient prior to procedure.  Informed consent was obtained.  DESCRIPTION OF PROCEDURE:  The patient was taken to the operating room and placed in the supine position.  The entire face was prepped and draped in the usual sterile fashion.  My attention was first directed to the right eye.  A lid speculum was placed.   The forced duction tests were performed and found to be negative.  The globe was then held at the superior temporal limbus.  The eye was depressed and adducted and an incision was made through the superior temporal fornix taken down to the posteriorsub-Tenon's space and the right superior rectus tendon was then isolated on a Stevens hook subsequently on a green hook.  A second green hook was then passed beneath the tendon.  This was  used to hold the globe in a depressed and adducted position.  Next  the superior oblique was isolated coursing inferior to the superior rectus.  Its fibers were sequestered on two Stevens hooks and brought into the section site.  The superior oblique tendon was then passed to the green hook.  A second green hook was  passed beneath the tendon.  A mark was then placed on the tendon at 8 mm from its insertion and the tendon was then traversed with a 6-0 Vicryl suture taking two locking bites at the medial and temporal apices.    The tendon was then advanced via plication to its nativeinsertion site and reattached at its insertion site with the pre-placed sutures.  The sutures were tied securely and the  conjunctiva was repositioned.  My attention was then directed to the ipsilateral right inferior oblique tendon.  The globe was held at the inferior temporal limbus.  The eye was then elevated and adducted and an incision was made through the inferior temporal fornix taken down to the posterior sub-Tenon's space and the right lateral rectus tendon was then isolated on a Stevens hook subsequently on a green hook.  This was used to hold the globe in an elevated and adducted position.  Next two Stevens hooks were placed in the incision site.  These wer then replaced with a Conan retractor and the inferior oblique was then isolated under direct visualization and incorporated on two Stevens hooks and brought into the dissection site.  It was carefully dissected free from its overlying muscle fascia and its muscle septae.  The tendon was then traversed with a 6-0 Vicryl suture at its insertion taking two locking bites at the medial and temporal apices.  It was then dissected free from its insertion and recessed to a point 3 mm posterior to the ipsilateral inferior rectus tendon and 2 mm lateral to this tendon.  It was then reattached to the globe at this position with the pre-placed sutures.  The sutures were tied  securely and the conjunctiva was  repositioned.  My attention was then directed to the left eye.  A lid speculum was placed.  Forced duction tests were performed and found to be negative.  The globe was then held at the inferior temporal limbus.  The eye was then adducted and elevated and an incision  was made through the inferior temporal fornix taken down to the posterior sub-Tenon's space and the left inferior rectus tendon was then isolated on a Stevens hook subsequently on a green hook.  It was then carefully dissected free from its overlying muscle fascia and its muscle septae for a distance of approximately 18 mm carefully dissecting off the capsulopalpebral fascia from the tendon.  The tendon was then traversed with a 6-0 Vicryl suture at its insertion taking two locking bites at the medial and temporal apices.  It was then dissected free from the globe and recessed to a point 3 mm from its insertion reattached to the globe with adjustable sutures.  Via the same incision my attention was directed to the left lateral rectus tendon.   The tendon was isolated on a Stevens hook subsequently on a green hook.  A second green hook was then passed beneath the tendon and this is used to hold the globe in an elevated and adducted position.  Next the tendon was then carefully dissected free from its overlying muscle fascia and its muscle septae was transected.  The tendon was then traversed with a 6-0 Vicryl suture taking two locking bites at the medial and temporal apices.  It was then dissected free from the globe and recessed to a point  3 mm from its native insertion and reattached to the globe at its insertion with the previous placed sutures in adjustable suture fashion.  At the conclusion of the procedure a double pressure patch was applied to the left eye.  The patient was subsequently transferred to the recovery room awakened from the anesthesia for approximately 30 minutes and then adjusted to  orthophoria with the  pre-placed sutures in the recovery room.  The sutures were then tied permanently and excess sutures cut.  The conjunctiva was repositioned.  TobraDex  ointment was then applied to both eyes.  There were no apparent complications.   PUS D: 08/26/2024 2:02:39 pm T: 08/26/2024 3:39:00 pm  JOB: [id number]/ [id number]

## 2024-08-26 NOTE — Interval H&P Note (Signed)
 History and Physical Interval Note:  08/26/2024 10:42 AM  Anita [name]  has presented today for surgery, with the diagnosis of RIGHT 4TH NERVE PALSY DIPLOPIA.  The various methods of treatment have been discussed with the patient and family. After consideration of risks, benefits and other options for treatment, the patient has consented to  Procedure(s): REPAIR STRABISMUS (Bilateral) as a surgical intervention.  The patient's history has been reviewed, patient examined, no change in status, stable for surgery.  I have reviewed the patient's chart and labs.  Questions were answered to the patient's satisfaction.     Ozell Mirza

## 2024-08-26 NOTE — Discharge Instructions (Signed)
 Advance  to PO as tolerated . Cool compresses ou Q 5 mins as tolerated. D/C IVF when PO Intake adequate. D/C to  home post VSS ,\  Tobradex  ointment  ou BID, D/C to home post VSS. F/U @ [facility] x 1 wk. Tylenol  # 3 sig 1 tab PO Q6 hrs /PRN.

## 2024-08-26 NOTE — Anesthesia Procedure Notes (Signed)
 Procedure Name: Intubation Date/Time: 08/26/2024 11:27 AM  Performed by: Cindie Donald CROME, CRNAPre-anesthesia Checklist: Patient identified, Emergency Drugs available, Suction available and Patient being monitored Patient Re-evaluated:Patient Re-evaluated prior to induction Oxygen Delivery Method: Circle System Utilized Preoxygenation: Pre-oxygenation with 100% oxygen Induction Type: IV induction Ventilation: Mask ventilation without difficulty Laryngoscope Size: Mac and 3 Grade View: Grade I Tube type: Oral Tube size: 7.0 mm Number of attempts: 1 Airway Equipment and Method: Stylet Placement Confirmation: ETT inserted through vocal cords under direct vision, positive ETCO2 and breath sounds checked- equal and bilateral Secured at: 21 cm Tube secured with: Tape Dental Injury: Teeth and Oropharynx as per pre-operative assessment  Comments: Dry cracked lips, did open up and bleed a small amount during DL. Lube placed.

## 2024-08-26 NOTE — Transfer of Care (Signed)
 Immediate Anesthesia Transfer of Care Note  Patient: Anita [name]  Procedure(s) Performed: RIGHT SUPERIOR OBLIQUE TUCK, RIGHT INFERIOR OBLIQUE RECESSION, LEFT INFERIOR RECTUS RECESSION, LEFT LATERAL RECTUS RECESSION, ADJUSTABLE SUTURES AT LEFT INFERIOR RECTUS (Bilateral: Eye)  Patient Location: PACU  Anesthesia Type:General  Level of Consciousness: awake, alert , oriented, drowsy, and patient cooperative  Airway & Oxygen Therapy: Patient Spontanous Breathing  Post-op Assessment: Report given to RN and Post -op Vital signs reviewed and stable  Post vital signs: Reviewed and stable  Last Vitals:  Vitals Value Taken Time  BP 107/65   Temp    Pulse 118 08/26/24 13:42  Resp 19 08/26/24 13:42  SpO2 94 % 08/26/24 13:42  Vitals shown include unfiled device data.  Last Pain:  Vitals:   08/26/24 0726  TempSrc:   PainSc: 0-No pain         Complications: No notable events documented.

## 2024-08-27 NOTE — Anesthesia Postprocedure Evaluation (Signed)
 Anesthesia Post Note  Patient: Anita [name]  Procedure(s) Performed: RIGHT SUPERIOR OBLIQUE TUCK, RIGHT INFERIOR OBLIQUE RECESSION, LEFT INFERIOR RECTUS RECESSION, LEFT LATERAL RECTUS RECESSION, ADJUSTABLE SUTURES AT LEFT INFERIOR RECTUS (Bilateral: Eye)     Patient location during evaluation: PACU Anesthesia Type: General Level of consciousness: sedated and patient cooperative Pain management: pain level controlled Vital Signs Assessment: post-procedure vital signs reviewed and stable Respiratory status: spontaneous breathing Cardiovascular status: stable Anesthetic complications: no   No notable events documented.  Last Vitals:  Vitals:   08/26/24 1415 08/26/24 1430  BP: (!) 104/58 111/68  Pulse: (!) 117 (!) 102  Resp: 18 16  Temp:  36.9 C  SpO2: 97% 96%    Last Pain:  Vitals:   08/26/24 1430  TempSrc:   PainSc: 0-No pain                 Norleen Pope

## 2024-08-28 ENCOUNTER — Encounter (HOSPITAL_COMMUNITY): Payer: Self-pay | Admitting: Ophthalmology

## 2024-10-12 ENCOUNTER — Encounter: Payer: Self-pay | Admitting: Neurology

## 2024-10-12 ENCOUNTER — Ambulatory Visit: Admitting: Neurology

## 2024-10-12 VITALS — BP 123/79 | HR 83 | Ht 62.0 in | Wt 127.0 lb

## 2024-10-12 DIAGNOSIS — R202 Paresthesia of skin: Secondary | ICD-10-CM | POA: Insufficient documentation

## 2024-10-12 DIAGNOSIS — D510 Vitamin B12 deficiency anemia due to intrinsic factor deficiency: Secondary | ICD-10-CM

## 2024-10-12 DIAGNOSIS — E538 Deficiency of other specified B group vitamins: Secondary | ICD-10-CM

## 2024-10-12 MED ORDER — CYANOCOBALAMIN 1000 MCG/ML IJ SOLN: 1000.0000 ug | INTRAMUSCULAR | 3 refills | Status: AC

## 2024-10-12 NOTE — Progress Notes (Signed)
 "  Chief Complaint  Patient presents with   Follow-up    Pt in room 14.alone.  Here for neuropathy follow up.      ASSESSMENT AND PLAN  Anita [name] is a 24 y.o. female   Intermittent paresthesia generalized weakness,  In the setting of severe B12 deficiency level was only 66, with positive intrinsic factor antibody, but negative parietal cell antibody  Her symptoms has much improved with B12 IM supplement, she is receiving 1000 mcg monthly at home, refilled her prescription,  She is continue to follow-up with her planned GI, rheumatology evaluation,  Her complaints of paresthesia is most consistent with her B12 deficiency,    Further decision about B12 supplement will be determined by her specialty visit, only return to clinic for new issues  DIAGNOSTIC DATA (LABS, IMAGING, TESTING) - I reviewed patient records, labs, notes, testing and imaging myself where available.   MEDICAL HISTORY:  Anita [name], is a 24 year old female seen in request by her primary care PA  Dwane, Alfonso, for evaluation of paresthesia, she was seen by Dr. Ines in July 2025  History is obtained from the patient and review of electronic medical records. I personally reviewed pertinent available imaging films in PACS.   PMHx of  Vit B12 deficiency, on Monthly Injection at home Anemia  She used to work part-time job at her parents restaurant, complete her college through [employer], around 2023, she noticed frequent chest pressure, chest pain, later noticed bilateral hands paresthesia, weakness, to the point of difficulty lifting a plate, then progressed to needing help with her daily activities such as feeding, she described difficulty picking up the fork, but denies gait abnormality, only intermittent lower extremity paresthesia  Laboratory evaluation in July 2025 showed significantly decreased B12 97, with positive intrinsic factor antibody,  She began IM B12 supplement, receiving  monthly injection at home now, about 3 months into injection she noticing movement, she is independent in her daily activity now, her paresthesia symptoms has much improved, no significant pain no major limitation in her activity  Per patient, she was seen by ophthalmologist, rheumatologist, for abnormal laboratory evaluation including mild elevation of C-reactive protein 9.4, there was a concern of underlying autoimmune disorder  Most recent B12 was in October 2025 at [facility],  Ferritin 23, B12 212, Hg 10.7, BMP  CT chest, abdomen/pelvis 1. Normal contour and caliber of the thoracic and abdominal aorta. No evidence of aneurysm, dissection, or other acute aortic pathology. No significant atherosclerosis. 2. No acute CT findings of the chest, abdomen, or pelvis. 3. Mild pectus deformity.    PHYSICAL EXAM:   Vitals:   10/12/24 1248  BP: 123/79  Pulse: 83  Weight: 127 lb (57.6 kg)  Height: 5' 2 (1.575 m)   Not recorded     Body mass index is 23.23 kg/m.  PHYSICAL EXAMNIATION:  Gen: NAD, conversant, well nourised, well groomed                     Cardiovascular: Regular rate rhythm, no peripheral edema, warm, nontender. Eyes: Conjunctivae clear without exudates or hemorrhage Neck: Supple, no carotid bruits. Pulmonary: Clear to auscultation bilaterally   NEUROLOGICAL EXAM:  MENTAL STATUS: Speech/cognition: Awake, alert, oriented to history taking and casual conversation CRANIAL NERVES: CN II: Visual fields are full to confrontation. Pupils are round equal and briskly reactive to light. CN III, IV, VI: extraocular movement are normal. No ptosis. CN V: Facial sensation is intact to light  touch CN VII: Face is symmetric with normal eye closure  CN VIII: Hearing is normal to causal conversation. CN IX, X: Phonation is normal. CN XI: Head turning and shoulder shrug are intact  MOTOR: There is no pronator drift of out-stretched arms. Muscle bulk and tone are normal.  Muscle strength is normal.  REFLEXES: Reflexes are 2+ and symmetric at the biceps, triceps, knees, and ankles. Plantar responses are flexor.  SENSORY: Intact to light touch, pinprick and vibratory sensation are intact in fingers and toes.  COORDINATION: There is no trunk or limb dysmetria noted.  GAIT/STANCE: Posture is normal. Gait is steady with normal steps, base, arm swing, and turning. Heel and toe walking are normal. Tandem gait is normal.  Romberg is absent.  REVIEW OF SYSTEMS:  Full 14 system review of systems performed and notable only for as above All other review of systems were negative.   ALLERGIES: Allergies[1]  HOME MEDICATIONS: Current Outpatient Medications  Medication Sig Dispense Refill   cyanocobalamin  (VITAMIN B12) 1000 MCG/ML injection Inject 1 mL (1,000 mcg total) into the muscle every 30 (thirty) days. 1 mL 5   Multiple Vitamin (MULTIVITAMIN WITH MINERALS) TABS tablet Take 2 tablets by mouth daily.     ondansetron  (ZOFRAN -ODT) 4 MG disintegrating tablet Take 1 tablet (4 mg total) by mouth every 8 (eight) hours as needed for nausea or vomiting. 20 tablet 0   SYRINGE-NEEDLE, DISP, 3 ML (BD SAFETYGLIDE SYRINGE/NEEDLE) 25G X 1 3 ML MISC Attach needle to syringe and use to draw up and administer B12. Do not reuse. 4 each 11   ferrous sulfate 325 (65 FE) MG tablet Take 325 mg by mouth daily. (Patient not taking: Reported on 10/12/2024)     gabapentin  (NEURONTIN ) 300 MG capsule Take 1 capsule (300 mg total) by mouth 3 (three) times daily as needed. (Patient not taking: Reported on 10/12/2024) 90 capsule 11   Norethindrone Acetate-Ethinyl Estrad-FE (JUNEL  FE 24) 1-20 MG-MCG(24) tablet Take 1 tablet by mouth daily. (Patient not taking: Reported on 10/12/2024) 84 tablet 4   omeprazole  (PRILOSEC) 20 MG capsule Take 1 capsule (20 mg total) by mouth daily. (Patient not taking: Reported on 10/12/2024) 14 capsule 0   tobramycin -dexamethasone  (TOBRADEX ) ophthalmic ointment  Place 1 Application into both eyes 2 (two) times daily at 10 am and 4 pm. (Patient not taking: Reported on 10/12/2024)     No current facility-administered medications for this visit.    PAST MEDICAL HISTORY: Past Medical History:  Diagnosis Date   Anemia    Anxiety    B12 deficiency    Lupus (systemic lupus erythematosus) (HCC)     PAST SURGICAL HISTORY: Past Surgical History:  Procedure Laterality Date   NO PAST SURGERIES     STRABISMUS SURGERY Bilateral 08/26/2024   Procedure: RIGHT SUPERIOR OBLIQUE TUCK, RIGHT INFERIOR OBLIQUE RECESSION, LEFT INFERIOR RECTUS RECESSION, LEFT LATERAL RECTUS RECESSION, ADJUSTABLE SUTURES AT LEFT INFERIOR RECTUS;  Surgeon: Jacques Sharper, MD;  Location: [facility] OR;  Service: Ophthalmology;  Laterality: Bilateral;    FAMILY HISTORY: Family History  Problem Relation Age of Onset   Anemia Mother    Thyroid  disease Maternal Grandmother    Anemia Maternal Grandmother     SOCIAL HISTORY: Social History   Socioeconomic History   Marital status: Single    Spouse name: Not on file   Number of children: Not on file   Years of education: Not on file   Highest education level: Not on file  Occupational History   Not  on file  Tobacco Use   Smoking status: Former    Types: E-cigarettes   Smokeless tobacco: Never  Vaping Use   Vaping status: Former  Substance and Sexual Activity   Alcohol use: Not Currently    Comment: socially   Drug use: Never   Sexual activity: Yes    Partners: Male    Comment: menarche 24yo, sexual debut 24yo  Other Topics Concern   Not on file  Social History Narrative   Not on file   Social Drivers of Health   Tobacco Use: Medium Risk (10/12/2024)   Patient History    Smoking Tobacco Use: Former    Smokeless Tobacco Use: Never    Passive Exposure: Not on Actuary Strain: Not on file  Food Insecurity: Low Risk (05/08/2024)   Received from [facility]   Epic    Within the past 12 months, you  worried that your food would run out before you got money to buy more: Never true    Within the past 12 months, the food you bought just didn't last and you didn't have money to get more. : Never true  Transportation Needs: No Transportation Needs (05/08/2024)   Received from [facility]    In the past 12 months, has lack of reliable transportation kept you from medical appointments, meetings, work or from getting things needed for daily living? : No  Physical Activity: Not on file  Stress: Not on file  Social Connections: Unknown (02/13/2022)   Received from [facility]   Social Network    Social Network: Not on file  Intimate Partner Violence: Unknown (01/05/2022)   Received from [facility]   HITS    Physically Hurt: Not on file    Insult or Talk Down To: Not on file    Threaten Physical Harm: Not on file    Scream or Curse: Not on file  Depression (PHQ2-9): Low Risk (03/20/2024)   Depression (PHQ2-9)    PHQ-2 Score: 0  Alcohol Screen: Not on file  Housing: Low Risk (05/08/2024)   Received from [facility]   Epic    What is your living situation today?: I have a steady place to live    Think about the place you live. Do you have problems with any of the following? Choose all that apply:: None/None on this list  Utilities: Low Risk (05/08/2024)   Received from [facility]   Utilities    In the past 12 months has the electric, gas, oil, or water company threatened to shut off services in your home? : No  Health Literacy: Not on file      Modena Callander, M.D. Ph.D.  [facility] [address] [place], [place] [address] Ph: [phone] Fax: [phone]  CC:  Dwane Odor, DEVONNA [address] [place],  [place] [address]  Dwane Odor, PA-C       [1]  Allergies Allergen Reactions   Amoxicillin Other (See Comments)    Patient can take pill form but liquid makes her tongue swell and gives her blisters.     "

## 2024-10-13 ENCOUNTER — Ambulatory Visit: Admitting: Neurology

## 2024-10-22 ENCOUNTER — Ambulatory Visit: Admitting: Radiology
